# Patient Record
Sex: Female | Born: 1985
Health system: Southern US, Community
[De-identification: ages and names within clinical notes are randomized; demographics above are authoritative.]

## PROBLEM LIST (undated history)

## (undated) DIAGNOSIS — F429 Obsessive-compulsive disorder, unspecified: Secondary | ICD-10-CM

## (undated) DIAGNOSIS — E039 Hypothyroidism, unspecified: Secondary | ICD-10-CM

## (undated) DIAGNOSIS — Z8619 Personal history of other infectious and parasitic diseases: Secondary | ICD-10-CM

## (undated) DIAGNOSIS — R87629 Unspecified abnormal cytological findings in specimens from vagina: Secondary | ICD-10-CM

## (undated) DIAGNOSIS — J45909 Unspecified asthma, uncomplicated: Secondary | ICD-10-CM

## (undated) HISTORY — DX: Personal history of other infectious and parasitic diseases: Z86.19

## (undated) HISTORY — DX: Unspecified abnormal cytological findings in specimens from vagina: R87.629

## (undated) HISTORY — DX: Obsessive-compulsive disorder, unspecified: F42.9

## (undated) HISTORY — DX: Hypothyroidism, unspecified: E03.9

## (undated) HISTORY — PX: LEEP: SHX91

## (undated) SURGERY — COLONOSCOPY WITH PROPOFOL
Anesthesia: Monitor Anesthesia Care

---

## 2015-04-26 LAB — OB RESULTS CONSOLE GBS: GBS: NEGATIVE

## 2015-10-25 LAB — OB RESULTS CONSOLE HEPATITIS B SURFACE ANTIGEN: HEP B S AG: NEGATIVE

## 2015-10-25 LAB — OB RESULTS CONSOLE ABO/RH: RH Type: POSITIVE

## 2015-10-25 LAB — OB RESULTS CONSOLE RUBELLA ANTIBODY, IGM: Rubella: IMMUNE

## 2015-10-25 LAB — OB RESULTS CONSOLE GC/CHLAMYDIA
CHLAMYDIA, DNA PROBE: NEGATIVE
Gonorrhea: NEGATIVE

## 2015-10-25 LAB — OB RESULTS CONSOLE HIV ANTIBODY (ROUTINE TESTING): HIV: NONREACTIVE

## 2015-10-25 LAB — OB RESULTS CONSOLE RPR: RPR: NONREACTIVE

## 2015-10-25 LAB — OB RESULTS CONSOLE ANTIBODY SCREEN: Antibody Screen: NEGATIVE

## 2015-12-03 ENCOUNTER — Inpatient Hospital Stay (HOSPITAL_COMMUNITY): Admission: AD | Admit: 2015-12-03 | Payer: Self-pay | Source: Ambulatory Visit | Admitting: Obstetrics and Gynecology

## 2016-04-24 NOTE — L&D Delivery Note (Signed)
Delivery Note  First Stage: Labor onset: 0900 Augmentation : pitocin Analgesia /Anesthesia intrapartum: epidural AROM at 1458 - MSF  Second Stage: Complete dilation at 1924 Onset of pushing at 1945 FHR second stage category 2 - variable decels Particulate MSF with second stage - NICU team in attendance at birth  Bulb suctioned on perineum / vigorous cry at birth NICU team excused from birth  Delivery of a viable female at 2013 by CNM in LOA position no nuchal cord / noted short cord Cord double clamped after cessation of pulsation, cut by FOB Cord blood sample collected   Arterial cord blood sample collected   Third Stage: Placenta delivered [facility] intact with 3 VC @ 20127 Placenta disposition: hospital disposal Uterine tone firm / bleeding moderate  2nd perineal laceration identified  Anesthesia for repair: epidural Repair 3-0 vicryl deep interrupted muscle repair with 4-0 vicryl subcuticular Est. Blood Loss (mL): [id number]  Complications: none  Mom to postpartum.  Baby to Couplet care / Skin to Skin.  Newborn: Birth Weight: 7 pounds 12.7 oz  Apgar Scores: 8-9 Feeding planned: breast  Artelia Laroche CNM, MSN, [place] 05/26/2016, 8:48 PM

## 2016-04-25 LAB — OB RESULTS CONSOLE GBS: STREP GROUP B AG: NEGATIVE

## 2016-05-25 ENCOUNTER — Telehealth (HOSPITAL_COMMUNITY): Payer: Self-pay | Admitting: *Deleted

## 2016-05-25 ENCOUNTER — Encounter (HOSPITAL_COMMUNITY): Payer: Self-pay | Admitting: *Deleted

## 2016-05-25 NOTE — Telephone Encounter (Signed)
Preadmission screen  

## 2016-05-26 ENCOUNTER — Inpatient Hospital Stay (HOSPITAL_COMMUNITY): Payer: BLUE CROSS/BLUE SHIELD | Admitting: Anesthesiology

## 2016-05-26 ENCOUNTER — Other Ambulatory Visit: Payer: Self-pay | Admitting: Certified Nurse Midwife

## 2016-05-26 ENCOUNTER — Inpatient Hospital Stay (HOSPITAL_COMMUNITY)
Admission: AD | Admit: 2016-05-26 | Discharge: 2016-05-28 | DRG: 775 | Disposition: A | Discharge status: Home / Self Care | Payer: BLUE CROSS/BLUE SHIELD | Source: Ambulatory Visit | Attending: Obstetrics and Gynecology | Admitting: Obstetrics and Gynecology

## 2016-05-26 ENCOUNTER — Encounter (HOSPITAL_COMMUNITY): Payer: Self-pay | Admitting: *Deleted

## 2016-05-26 DIAGNOSIS — O99214 Obesity complicating childbirth: Secondary | ICD-10-CM | POA: Diagnosis present

## 2016-05-26 DIAGNOSIS — E669 Obesity, unspecified: Secondary | ICD-10-CM | POA: Diagnosis present

## 2016-05-26 DIAGNOSIS — F411 Generalized anxiety disorder: Secondary | ICD-10-CM | POA: Diagnosis present

## 2016-05-26 DIAGNOSIS — Z3A41 41 weeks gestation of pregnancy: Secondary | ICD-10-CM

## 2016-05-26 DIAGNOSIS — O9962 Diseases of the digestive system complicating childbirth: Secondary | ICD-10-CM | POA: Diagnosis present

## 2016-05-26 DIAGNOSIS — O99344 Other mental disorders complicating childbirth: Secondary | ICD-10-CM | POA: Diagnosis present

## 2016-05-26 DIAGNOSIS — E039 Hypothyroidism, unspecified: Secondary | ICD-10-CM | POA: Diagnosis present

## 2016-05-26 DIAGNOSIS — Z6830 Body mass index (BMI) 30.0-30.9, adult: Secondary | ICD-10-CM

## 2016-05-26 DIAGNOSIS — O480 Post-term pregnancy: Secondary | ICD-10-CM | POA: Diagnosis present

## 2016-05-26 DIAGNOSIS — F429 Obsessive-compulsive disorder, unspecified: Secondary | ICD-10-CM | POA: Diagnosis present

## 2016-05-26 DIAGNOSIS — K219 Gastro-esophageal reflux disease without esophagitis: Secondary | ICD-10-CM | POA: Diagnosis present

## 2016-05-26 DIAGNOSIS — O99284 Endocrine, nutritional and metabolic diseases complicating childbirth: Secondary | ICD-10-CM | POA: Diagnosis present

## 2016-05-26 LAB — CBC
HEMATOCRIT: 38.5 % (ref 36.0–46.0)
HEMOGLOBIN: 13.6 g/dL (ref 12.0–15.0)
MCH: 31.1 pg (ref 26.0–34.0)
MCHC: 35.3 g/dL (ref 30.0–36.0)
MCV: 87.9 fL (ref 78.0–100.0)
Platelets: 186 10*3/uL (ref 150–400)
RBC: 4.38 MIL/uL (ref 3.87–5.11)
RDW: 13.6 % (ref 11.5–15.5)
WBC: 8.2 10*3/uL (ref 4.0–10.5)

## 2016-05-26 LAB — TYPE AND SCREEN
ABO/RH(D): O POS
ANTIBODY SCREEN: NEGATIVE

## 2016-05-26 LAB — ABO/RH: ABO/RH(D): O POS

## 2016-05-26 MED ORDER — LACTATED RINGERS IV SOLN: INTRAVENOUS | Status: DC

## 2016-05-26 MED ORDER — EPHEDRINE 5 MG/ML INJ
10.0000 mg | INTRAVENOUS | Status: DC | PRN
  Filled 2016-05-26: qty 4

## 2016-05-26 MED ORDER — OXYTOCIN 40 UNITS IN LACTATED RINGERS INFUSION - SIMPLE MED
1.0000 m[IU]/min | INTRAVENOUS | Status: DC
Start: 2016-05-26 — End: 2016-05-26
  Administered 2016-05-26: 8 m[IU]/min via INTRAVENOUS
  Administered 2016-05-26: 2 m[IU]/min via INTRAVENOUS
  Filled 2016-05-26: qty 1000

## 2016-05-26 MED ORDER — MONTELUKAST SODIUM 10 MG PO TABS
10.0000 mg | ORAL_TABLET | Freq: Every day | ORAL | Status: DC
  Administered 2016-05-27: 10 mg via ORAL
  Filled 2016-05-26 (×3): qty 1

## 2016-05-26 MED ORDER — DIBUCAINE 1 % RE OINT: 1.0000 "application " | TOPICAL_OINTMENT | RECTAL | Status: DC | PRN

## 2016-05-26 MED ORDER — LACTATED RINGERS IV SOLN: 500.0000 mL | Freq: Once | INTRAVENOUS | Status: DC

## 2016-05-26 MED ORDER — BENZOCAINE-MENTHOL 20-0.5 % EX AERO
1.0000 "application " | INHALATION_SPRAY | CUTANEOUS | Status: DC | PRN
  Filled 2016-05-26: qty 56

## 2016-05-26 MED ORDER — OXYTOCIN 40 UNITS IN LACTATED RINGERS INFUSION - SIMPLE MED: 2.5000 [IU]/h | INTRAVENOUS | Status: DC

## 2016-05-26 MED ORDER — FENTANYL 2.5 MCG/ML BUPIVACAINE 1/10 % EPIDURAL INFUSION (WH - ANES)
14.0000 mL/h | INTRAMUSCULAR | Status: DC | PRN
  Administered 2016-05-26: 14 mL/h via EPIDURAL
  Filled 2016-05-26: qty 100

## 2016-05-26 MED ORDER — PHENYLEPHRINE 40 MCG/ML (10ML) SYRINGE FOR IV PUSH (FOR BLOOD PRESSURE SUPPORT)
80.0000 ug | PREFILLED_SYRINGE | INTRAVENOUS | Status: DC | PRN
  Filled 2016-05-26: qty 5

## 2016-05-26 MED ORDER — WITCH HAZEL-GLYCERIN EX PADS
1.0000 "application " | MEDICATED_PAD | CUTANEOUS | Status: DC | PRN
  Administered 2016-05-28: 1 via TOPICAL

## 2016-05-26 MED ORDER — SOD CITRATE-CITRIC ACID 500-334 MG/5ML PO SOLN: 30.0000 mL | ORAL | Status: DC | PRN

## 2016-05-26 MED ORDER — NALBUPHINE HCL 10 MG/ML IJ SOLN
10.0000 mg | Freq: Once | INTRAMUSCULAR | Status: AC
  Administered 2016-05-26: 10 mg via INTRAVENOUS
  Filled 2016-05-26: qty 1

## 2016-05-26 MED ORDER — OXYCODONE-ACETAMINOPHEN 5-325 MG PO TABS: 2.0000 | ORAL_TABLET | ORAL | Status: DC | PRN

## 2016-05-26 MED ORDER — OXYCODONE-ACETAMINOPHEN 5-325 MG PO TABS: 1.0000 | ORAL_TABLET | ORAL | Status: DC | PRN

## 2016-05-26 MED ORDER — ACETAMINOPHEN 325 MG PO TABS: 650.0000 mg | ORAL_TABLET | ORAL | Status: DC | PRN

## 2016-05-26 MED ORDER — OXYTOCIN BOLUS FROM INFUSION: 500.0000 mL | Freq: Once | INTRAVENOUS | Status: DC

## 2016-05-26 MED ORDER — OXYTOCIN 10 UNIT/ML IJ SOLN
10.0000 [IU] | Freq: Once | INTRAMUSCULAR | Status: DC | PRN
  Filled 2016-05-26: qty 1

## 2016-05-26 MED ORDER — SIMETHICONE 80 MG PO CHEW
80.0000 mg | CHEWABLE_TABLET | ORAL | Status: DC | PRN
  Administered 2016-05-27: 80 mg via ORAL
  Filled 2016-05-26: qty 1

## 2016-05-26 MED ORDER — LEVOTHYROXINE SODIUM 50 MCG PO TABS
50.0000 ug | ORAL_TABLET | Freq: Every day | ORAL | Status: DC
  Administered 2016-05-27 – 2016-05-28 (×2): 50 ug via ORAL
  Filled 2016-05-26 (×3): qty 1

## 2016-05-26 MED ORDER — IBUPROFEN 600 MG PO TABS
600.0000 mg | ORAL_TABLET | Freq: Four times a day (QID) | ORAL | Status: DC
  Administered 2016-05-26 – 2016-05-28 (×7): 600 mg via ORAL
  Filled 2016-05-26 (×7): qty 1

## 2016-05-26 MED ORDER — LIDOCAINE HCL (PF) 1 % IJ SOLN
INTRAMUSCULAR | Status: DC | PRN
  Administered 2016-05-26: 2 mL via EPIDURAL
  Administered 2016-05-26: 3 mL via EPIDURAL
  Administered 2016-05-26: 5 mL via EPIDURAL

## 2016-05-26 MED ORDER — SENNOSIDES-DOCUSATE SODIUM 8.6-50 MG PO TABS
2.0000 | ORAL_TABLET | ORAL | Status: DC
  Administered 2016-05-27: 2 via ORAL
  Filled 2016-05-26: qty 2

## 2016-05-26 MED ORDER — LACTATED RINGERS IV SOLN: 500.0000 mL | INTRAVENOUS | Status: DC | PRN

## 2016-05-26 MED ORDER — COCONUT OIL OIL: 1.0000 "application " | TOPICAL_OIL | Status: DC | PRN

## 2016-05-26 MED ORDER — DOCUSATE SODIUM 100 MG PO CAPS
100.0000 mg | ORAL_CAPSULE | Freq: Every day | ORAL | Status: DC
  Administered 2016-05-27 – 2016-05-28 (×2): 100 mg via ORAL
  Filled 2016-05-26 (×2): qty 1

## 2016-05-26 MED ORDER — TERBUTALINE SULFATE 1 MG/ML IJ SOLN
0.2500 mg | Freq: Once | INTRAMUSCULAR | Status: DC | PRN
  Filled 2016-05-26: qty 1

## 2016-05-26 MED ORDER — DIPHENHYDRAMINE HCL 50 MG/ML IJ SOLN: 12.5000 mg | INTRAMUSCULAR | Status: DC | PRN

## 2016-05-26 MED ORDER — PHENYLEPHRINE 40 MCG/ML (10ML) SYRINGE FOR IV PUSH (FOR BLOOD PRESSURE SUPPORT)
80.0000 ug | PREFILLED_SYRINGE | INTRAVENOUS | Status: DC | PRN
  Filled 2016-05-26: qty 5
  Filled 2016-05-26: qty 10

## 2016-05-26 MED ORDER — LIDOCAINE HCL (PF) 1 % IJ SOLN
30.0000 mL | INTRAMUSCULAR | Status: DC | PRN
  Filled 2016-05-26: qty 30

## 2016-05-26 NOTE — H&P (Signed)
OB ADMISSION/ HISTORY & PHYSICAL:  Admission Date: 05/26/2016 12:30 PM  Admit Diagnosis: Postdates Pregnancy / Early Labor   Wendy Clements is a 31 y.o. female presenting for mild irregular contractions all morning. She was seen in the office this morning by Dr. Benjie Karvonen. She was sent here for direct admission to L&D.  Prenatal History: G3P0020   EDC : 05/16/2016, by Ultrasound Prenatal care at [facility] since 10.[redacted] weeks gestation Primary Care Provider at [facility]: Artelia Laroche, CNM  Prenatal course complicated by H/O LEEP / GERD / Generalized Anxiety Disorder / Hyothyroidism / Obsessive Compulsive disorder  Prenatal Labs: ABO, Rh: O (07/03 0000)  Antibody: Negative (07/03 0000) Rubella: Immune (07/03 0000)  RPR: Nonreactive (07/03 0000)  HBsAg: Negative (07/03 0000)  HIV: Non-reactive (07/03 0000)  GBS: Negative (01/02 0000)  GTT : Abnormal 1 hr / Normal 3 hr    Medical / Surgical History :  Past medical history:  Past Medical History:  Diagnosis Date  . Hx of varicella   . Hypothyroidism   . OCD (obsessive compulsive disorder)   . Vaginal Pap smear, abnormal      Past surgical history:  Past Surgical History:  Procedure Laterality Date  . LEEP       Family History:  Family History  Problem Relation Age of Onset  . Cancer Maternal Grandmother      Social History:  reports that she has never smoked. She has never used smokeless tobacco. She reports that she does not drink alcohol or use drugs.   Allergies: Patient has no known allergies.    Current Medications at time of admission:  Prescriptions Prior to Admission  Medication Sig Dispense Refill Last Dose  . levothyroxine (SYNTHROID, LEVOTHROID) 25 MCG tablet Take 0.5 mcg by mouth daily before breakfast.   05/26/2016 at Unknown time  . montelukast (SINGULAIR) 10 MG tablet Take 10 mg by mouth at bedtime.         Review of Systems: Active FM onset of ctx this AM currently every  5-6 minutes bloody show minimal   Physical Exam:  Today's Vitals   05/26/16 1303  BP: 115/78  Pulse: 84  Resp: 18  Temp: 98 F (36.7 C)  TempSrc: Oral  Weight: 75.3 kg (166 lb)  Height: 5\' 2"  (1.575 m)  PainSc: 2    General: alert and oriented x 3, NAD Heart: RRR, no murmurs Lungs: CTAB Abdomen: Soft and non-tender, non-distended / Uterus: Gravid, non-tender Extremities: no edema Genitalia / VE: Dilation: 3 Effacement (%): 70 Station: -1 Exam by:: R Sudeep Scheibel CNM  Membrane sweep - pt tolerated well FHR: 135 bpm / moderate variability / accels present / no decels TOCO: regular every 5-6 mins  Labs:    No results for input(s): WBC, HGB, HCT, PLT in the last 72 hours.   Assessment:  31 y.o. G3P0020 at [redacted]w[redacted]d  1. Labor: early 2. Fetal Wellbeing: Category 1  3. Pain Control: planning an epidural 4. GBS: Negative   Plan:  1. Admit to BS 2. Routine L&D orders 3. Analgesia/anesthesia PRN     Dr. Ronita Hipps notified of admission / plan of care    Graceann Congress, MSN, CNM 05/26/2016, 1:45 PM

## 2016-05-26 NOTE — Lactation Note (Signed)
This note was copied from a baby's chart. Lactation Consultation Note  Patient Name: Wendy Clements [id number] Date: 05/26/2016 Reason for consult: Initial assessment Baby at 1 hr of life. Mom has firm breast with short nipples. Baby has a nice gape, flanged lips, and rhythmic suck. Baby needs mild stimulation to maintain bursts of sucking. Discussed baby behavior, feeding frequency, baby belly size, voids, wt loss, breast changes, and nipple care. Given lactation handouts. Aware of OP services and support group.    Maternal Data Has patient been taught Hand Expression?: Yes Does the patient have breastfeeding experience prior to this delivery?: No  Feeding Feeding Type: Breast Fed  LATCH Score/Interventions Latch: Repeated attempts needed to sustain latch, nipple held in mouth throughout feeding, stimulation needed to elicit sucking reflex. Intervention(s): Adjust position;Assist with latch;Breast massage;Breast compression  Audible Swallowing: A few with stimulation Intervention(s): Skin to skin  Type of Nipple: Everted at rest and after stimulation  Comfort (Breast/Nipple): Soft / non-tender     Hold (Positioning): Full assist, staff holds infant at breast Intervention(s): Position options  LATCH Score: 6  Lactation Tools Discussed/Used     Consult Status Consult Status: Follow-up Date: 05/27/16 Follow-up type: In-patient    Denzil Hughes 05/26/2016, 9:18 PM

## 2016-05-26 NOTE — Anesthesia Procedure Notes (Signed)
Epidural Patient location during procedure: OB Start time: 05/26/2016 6:17 PM End time: 05/26/2016 6:21 PM  Staffing Anesthesiologist: Catalina Gravel Performed: anesthesiologist   Preanesthetic Checklist Completed: patient identified, pre-op evaluation, timeout performed, IV checked, risks and benefits discussed and monitors and equipment checked  Epidural Patient position: sitting Prep: DuraPrep Patient monitoring: blood pressure and continuous pulse ox Approach: midline Location: L3-L4 Injection technique: LOR air  Needle:  Needle type: Tuohy  Needle gauge: 17 G Needle length: 9 cm Needle insertion depth: 4 cm Catheter size: 19 Gauge Catheter at skin depth: 9 cm Test dose: negative and Other (1% Lidocaine)  Additional Notes Patient identified.  Risk benefits discussed including failed block, incomplete pain control, headache, nerve damage, paralysis, blood pressure changes, nausea, vomiting, reactions to medication both toxic or allergic, and postpartum back pain.  Patient expressed understanding and wished to proceed.  All questions were answered.  Sterile technique used throughout procedure and epidural site dressed with sterile barrier dressing. No paresthesia or other complications noted. The patient did not experience any signs of intravascular injection such as tinnitus or metallic taste in mouth nor signs of intrathecal spread such as rapid motor block. Please see nursing notes for vital signs. Reason for block:procedure for pain

## 2016-05-26 NOTE — Anesthesia Pain Management Evaluation Note (Signed)
  CRNA Pain Management Visit Note  Patient: Wendy Clements, 31 y.o., female  "Hello I am a member of the anesthesia team at [facility]. We have an anesthesia team available at all times to provide care throughout the hospital, including epidural management and anesthesia for C-section. I don't know your plan for the delivery whether it a natural birth, water birth, IV sedation, nitrous supplementation, doula or epidural, but we want to meet your pain goals."   1.Was your pain managed to your expectations on prior hospitalizations?   No prior hospitalizations  2.What is your expectation for pain management during this hospitalization?     Epidural  3.How can we help you reach that goal? unsure  Record the patient's initial score and the patient's pain goal.   Pain: 2  Pain Goal: 6 [facility] wants you to be able to say your pain was always managed very well.  Casimer Lanius 05/26/2016

## 2016-05-26 NOTE — Progress Notes (Signed)
Patient ID: Wendy Clements, female   DOB: 06-Dec-1985, 31 y.o.   MRN: [id number] Subjective: Wendy Clements is a 31 y.o. G3P0020 at [redacted]w[redacted]d by ultrasound admitted for postdates / early labor.  Objective: Vitals:   05/26/16 1303  BP: 115/78  Pulse: 84  Resp: 18  Temp: 98 F (36.7 C)  TempSrc: Oral  Weight: 75.3 kg (166 lb)  Height: 5\' 2"  (1.575 m)      FHT:  FHR: 135 bpm, variability: moderate,  accelerations:  Present,  decelerations:  Absent UC:   irregular, every 2-4 minutes SVE:   Dilation: 3 Effacement (%): 70 Station: -1 Exam by:: R Dilan Fullenwider CNM AROM with small amount of bloody fluid  Labs:   Recent Labs  05/26/16 1421  WBC 8.2  HGB 13.6  HCT 38.5  PLT 186    Assessment / Plan: Spontaneous labor, progressing normally  Labor: Progressing normally  Start low-dose pitocin Preeclampsia:  n/a Fetal Wellbeing:  Category I Pain Control:  Labor support without medications Anticipated MOD:  NSVD   **Care assumed by Artelia Laroche, CNM at this time.  Graceann Congress, MSN, CNM 05/26/2016, 3:06 PM

## 2016-05-26 NOTE — Anesthesia Preprocedure Evaluation (Signed)
Anesthesia Evaluation  Patient identified by MRN, date of birth, ID band Patient awake    Reviewed: Allergy & Precautions, NPO status , Patient's Chart, lab work & pertinent test results  Airway Mallampati: II  TM Distance: >3 FB Neck ROM: Full    Dental  (+) Teeth Intact, Dental Advisory Given   Pulmonary asthma ,    Pulmonary exam normal breath sounds clear to auscultation       Cardiovascular negative cardio ROS Normal cardiovascular exam Rhythm:Regular Rate:Normal     Neuro/Psych PSYCHIATRIC DISORDERS (OCD) negative neurological ROS     GI/Hepatic negative GI ROS, Neg liver ROS,   Endo/Other  Hypothyroidism Obesity   Renal/GU negative Renal ROS     Musculoskeletal negative musculoskeletal ROS (+)   Abdominal   Peds  Hematology negative hematology ROS (+) Plt 186k   Anesthesia Other Findings Day of surgery medications reviewed with the patient.  Reproductive/Obstetrics (+) Pregnancy                             Anesthesia Physical Anesthesia Plan  ASA: II  Anesthesia Plan: Epidural   Post-op Pain Management:    Induction:   Airway Management Planned:   Additional Equipment:   Intra-op Plan:   Post-operative Plan:   Informed Consent: I have reviewed the patients History and Physical, chart, labs and discussed the procedure including the risks, benefits and alternatives for the proposed anesthesia with the patient or authorized representative who has indicated his/her understanding and acceptance.   Dental advisory given  Plan Discussed with:   Anesthesia Plan Comments: (Patient identified. Risks/Benefits/Options discussed with patient including but not limited to bleeding, infection, nerve damage, paralysis, failed block, incomplete pain control, headache, blood pressure changes, nausea, vomiting, reactions to medication both or allergic, itching and postpartum back pain.  Confirmed with bedside nurse the patient's most recent platelet count. Confirmed with patient that they are not currently taking any anticoagulation, have any bleeding history or any family history of bleeding disorders. Patient expressed understanding and wished to proceed. All questions were answered. )        Anesthesia Quick Evaluation

## 2016-05-27 LAB — CBC
HCT: 29.5 % — ABNORMAL LOW (ref 36.0–46.0)
Hemoglobin: 10.7 g/dL — ABNORMAL LOW (ref 12.0–15.0)
MCH: 31.9 pg (ref 26.0–34.0)
MCHC: 36.3 g/dL — ABNORMAL HIGH (ref 30.0–36.0)
MCV: 88.1 fL (ref 78.0–100.0)
Platelets: 166 10*3/uL (ref 150–400)
RBC: 3.35 MIL/uL — ABNORMAL LOW (ref 3.87–5.11)
RDW: 13.7 % (ref 11.5–15.5)
WBC: 12.5 10*3/uL — ABNORMAL HIGH (ref 4.0–10.5)

## 2016-05-27 LAB — RPR: RPR Ser Ql: NONREACTIVE

## 2016-05-27 NOTE — Progress Notes (Signed)
Patient ID: Wendy Clements, female   DOB: 1985-05-27, 31 y.o.   MRN: [id number] PPD # 1 SVD  S:  Reports feeling well, but bottom is still very sore.             Tolerating po/ No nausea or vomiting             Bleeding is light             Pain controlled with ibuprofen (OTC)             Up ad lib / ambulatory / voiding without difficulties     Information for the patient's newborn:  Kalliyan, Gunnett Girl Alonzo [id number]  female "Scarlett"   breast feeding    O:  A & O x 3, in no apparent distress              VS:  Vitals:   05/26/16 2230 05/26/16 2302 05/27/16 0003 05/27/16 0402  BP: (!) 88/61 (!) 101/59 101/62 (!) 102/55  Pulse: (!) 124 (!) 104 99 98  Resp:  20 18 18   Temp:  99.7 F (37.6 C) 98.6 F (37 C) 97.7 F (36.5 C)  TempSrc:  Oral Oral Oral  SpO2:  99% 100% 98%  Weight:      Height:        LABS:  Recent Labs  05/26/16 1421 05/27/16 0534  WBC 8.2 12.5*  HGB 13.6 10.7*  HCT 38.5 29.5*  PLT 186 166    Blood type: --/--/O POS (02/02 1420)  Rubella: Immune (07/03 0000)   I&O: I/O last 3 completed shifts: In: -  Out: 500 [Urine:200; Blood:300]          No intake/output data recorded.    Abdomen: soft, non-tender, non-distended             Fundus: firm, non-tender, U-even, deviated to maternal RT  Perineum: 2nd degree repair healing well, no edema  Lochia: minimal  Extremities: Trace edema, no calf pain or tenderness    A/P: PPD # 1 31 y.o., [id number]   Principal Problem:   Postpartum care following vaginal delivery (2/2) Active Problems:   Indication for care in labor or delivery   SVD (spontaneous vaginal delivery)    Doing well - stable status  Routine post partum orders  Anticipate discharge tomorrow    Laury Deep, M, MSN, CNM 05/27/2016, 9:10 AM

## 2016-05-27 NOTE — Lactation Note (Signed)
This note was copied from a baby's chart. Lactation Consultation Note  Patient Name: Wendy Clements [id number] Date: 05/27/2016 Reason for consult: Follow-up assessment Infant is 75 hours old & seen by lactation for follow-up assessment. RN told LC that mom was going to try BF soon and to see if LC could go in to talk with mom since she had not fed since 11am today. Baby was in football hold on right breast when [place] entered. Mom had several visitors so was using a cover-up. Did not see initial latch, but baby did come off a little while LC observing. Baby's lips were flanged but latch did not seem very deep so encouraged mom to continue supporting her breast throughout the whole feeding so she stays latched onto her breast and not just the nipple. Mom reports that baby cluster fed from ~3am-8am and then fed again at 11am and has been sleeping since then so she didn't think baby needed to feed. Mom reports that one of the feedings she noticed that her nipple was squished after BF so knows that it was not a deep latch even though it was comfortable. Mom encouraged to feed baby 8-12 times/24 hours and with feeding cues. And that if baby goes ~3.5hrs without feeding cues for mom to put baby skin-to-skin to see if that will arouse her to wake up & eat.  Mom stated her neighbor is an IBCLC so she has received a lot of education prenatally & knows she can ask her for help once home. Mom reports no questions at this time. Encouraged mom to ask for nurse at future feedings if help is needed.   Maternal Data    Feeding Feeding Type: Breast Fed Length of feed:  (baby still feeding when LC left)  LATCH Score/Interventions Latch: Repeated attempts needed to sustain latch, nipple held in mouth throughout feeding, stimulation needed to elicit sucking reflex.  Audible Swallowing: A few with stimulation Intervention(s): Hand expression;Alternate breast massage;Skin to skin  Type of Nipple: Everted at rest  and after stimulation  Comfort (Breast/Nipple): Soft / non-tender     Hold (Positioning): No assistance needed to correctly position infant at breast. Intervention(s): Breastfeeding basics reviewed  LATCH Score: 8  Lactation Tools Discussed/Used     Consult Status Consult Status: Follow-up Date: 05/28/16    Yvonna Alanis 05/27/2016, 6:39 PM

## 2016-05-27 NOTE — Anesthesia Postprocedure Evaluation (Signed)
Anesthesia Post Note  Patient: Wendy Clements  Procedure(s) Performed: * No procedures listed *  Patient location during evaluation: Mother Baby Anesthesia Type: Epidural Level of consciousness: awake and alert Pain management: pain level controlled Vital Signs Assessment: post-procedure vital signs reviewed and stable Respiratory status: spontaneous breathing, nonlabored ventilation and respiratory function stable Cardiovascular status: stable Postop Assessment: no headache, no backache and epidural receding Anesthetic complications: no        Last Vitals:  Vitals:   05/27/16 0003 05/27/16 0402  BP: 101/62 (!) 102/55  Pulse: 99 98  Resp: 18 18  Temp: 37 C 36.5 C    Last Pain:  Vitals:   05/27/16 0555  TempSrc:   PainSc: 3    Pain Goal:                 Riki Sheer

## 2016-05-28 MED ORDER — IBUPROFEN 600 MG PO TABS: 600.0000 mg | ORAL_TABLET | Freq: Four times a day (QID) | ORAL | 0 refills | Status: DC

## 2016-05-28 NOTE — Lactation Note (Signed)
This note was copied from a baby's chart. Lactation Consultation Note  Patient Name: Wendy Clements [id number] Date: 05/28/2016  Follow up visit made prior to discharge.  Mom states baby cluster fed last night and it was somewhat overwhelming.  Reassured this is normal and her volume of milk should increase in next 24-48 hours.  Reviewed techniques for a deeper latch.  Discharge teaching done including engorgement treatment.  Lactation outpatient services and support reviewed and encouraged prn.   Maternal Data    Feeding    LATCH Score/Interventions                      Lactation Tools Discussed/Used     Consult Status      Ave Filter 05/28/2016, 9:37 AM

## 2016-05-28 NOTE — Progress Notes (Signed)
MOB was referred for history of anxiety/OCD.  Referral is screened out by Clinical Social Worker because none of the following criteria appear to apply and there are no reports impacting the pregnancy or her transition to the postpartum period.  CSW does not deem it clinically necessary to further investigate at this time.   -History of anxiety/OCD during this pregnancy, or of post-partum depression - Diagnosis of anxiety and/or OCD within last 3 years - History of anxiety due to pregnancy loss/loss of child or -MOB's symptoms are currently being treated with medication and/or therapy  CSW met with MOB at bedside in attempt to complete assessment for consult regarding hx of anxiety/ OCD. MOB notes neither of those are formal dx and she is not taking med's to treat either. MOB notes she experienced anxiety several years ago and does not feel she needs assessment of either. Please contact the Clinical Social Worker if needs arise or upon MOB request.    Oda Cogan, MSW, [facility]  Office: [phone]

## 2016-05-28 NOTE — Discharge Summary (Signed)
OB Discharge Summary     Patient Name: Wendy Clements DOB: 12-Mar-1986 MRN: [id number]  Date of admission: 05/26/2016 Delivering MD: Artelia Laroche   Date of discharge: 05/28/2016  Admitting diagnosis: EARLY LABOR Intrauterine pregnancy: [redacted]w[redacted]d     Secondary diagnosis:  Principal Problem:   Postpartum care following vaginal delivery (2/2) Active Problems:   Indication for care in labor or delivery   SVD (spontaneous vaginal delivery)  Additional problems: none     Discharge diagnosis: Postterm Pregnancy, delivered                                                                                                Post partum procedures:none  Augmentation: AROM, Pitocin and Foley Balloon  Complications: None  Hospital course:  Onset of Labor With Vaginal Delivery     31 y.o. yo [id number] at [redacted]w[redacted]d was admitted in Latent Labor on 05/26/2016. Patient had an uncomplicated labor course as follows:  Membrane Rupture Time/Date: 2:58 PM ,05/26/2016   Intrapartum Procedures: Episiotomy: None [1]                                         Lacerations:  2nd degree [3];Perineal [11]  Patient had a delivery of a Viable infant. 05/26/2016  Information for the patient's newborn:  Roha, Pond [id number]  Delivery Method: Vag-Spont    Pateint had an uncomplicated postpartum course.  She is ambulating, tolerating a regular diet, passing flatus, and urinating well. Patient is discharged home in stable condition on 05/28/16.   Physical exam  Vitals:   05/26/16 2302 05/27/16 0003 05/27/16 0402 05/27/16 1834  BP: (!) 101/59 101/62 (!) 102/55 115/71  Pulse: (!) 104 99 98 98  Resp: 20 18 18 16   Temp: 99.7 F (37.6 C) 98.6 F (37 C) 97.7 F (36.5 C) 99.1 F (37.3 C)  TempSrc: Oral Oral Oral Oral  SpO2: 99% 100% 98% 100%  Weight:      Height:       General: alert, cooperative and no distress Lochia: appropriate Uterine Fundus: firm, midline, U-1 DVT Evaluation: No evidence of DVT seen  on physical exam. Negative Homan's sign. No cords or calf tenderness. No significant calf/ankle edema. Labs: Lab Results  Component Value Date   WBC 12.5 (H) 05/27/2016   HGB 10.7 (L) 05/27/2016   HCT 29.5 (L) 05/27/2016   MCV 88.1 05/27/2016   PLT 166 05/27/2016   No flowsheet data found.  Discharge instruction: per After Visit Summary and "Baby and Me Booklet".  After visit meds:  Allergies as of 05/28/2016   No Known Allergies     Medication List    TAKE these medications   acetaminophen 500 MG tablet Commonly known as:  TYLENOL Take 1,000 mg by mouth every 6 (six) hours as needed for mild pain.   ADVAIR DISKUS 250-50 MCG/DOSE Aepb Generic drug:  Fluticasone-Salmeterol Inhale 1 puff into the lungs daily.   albuterol 108 (90 Base) MCG/ACT inhaler Commonly known as:  PROVENTIL HFA;VENTOLIN  HFA Inhale 1-2 puffs into the lungs every 6 (six) hours as needed for wheezing or shortness of breath.   Butalbital-APAP-Caffeine 50-300-40 MG Caps Take 1 tablet by mouth daily as needed.   calcium carbonate 500 MG chewable tablet Commonly known as:  TUMS - dosed in mg elemental calcium Chew 1-2 tablets by mouth 3 (three) times daily as needed for indigestion or heartburn.   ibuprofen 600 MG tablet Commonly known as:  ADVIL,MOTRIN Take 1 tablet (600 mg total) by mouth every 6 (six) hours.   levothyroxine 50 MCG tablet Commonly known as:  SYNTHROID, LEVOTHROID Take 50 mcg by mouth daily.   montelukast 10 MG tablet Commonly known as:  SINGULAIR Take 10 mg by mouth at bedtime.   prenatal multivitamin Tabs tablet Take 1 tablet by mouth daily at 12 noon.   ranitidine 150 MG tablet Commonly known as:  ZANTAC Take 150 mg by mouth 2 (two) times daily.       Diet: routine diet  Activity: Advance as tolerated. Pelvic rest for 6 weeks.   Outpatient follow up:6 weeks Follow up Appt:No future appointments. Follow up Visit:No Follow-up on file.  Postpartum contraception:  Undecided  Newborn Data: Live born female "Scarlett" on 05/26/2016 Birth Weight: 7 lb 12.7 oz (3535 g) APGAR: 8, 9  Baby Feeding: Breast Disposition:home with mother   05/28/2016 Laury Deep, Jerilynn Mages, CNM

## 2016-05-28 NOTE — Discharge Instructions (Signed)
Postpartum Depression and Baby Blues The postpartum period begins right after the birth of a baby. During this time, there is often a great amount of joy and excitement. It is also a time of many changes in the life of the parents. Regardless of how many times a mother gives birth, each child brings new challenges and dynamics to the family. It is not unusual to have feelings of excitement along with confusing shifts in moods, emotions, and thoughts. All mothers are at risk of developing postpartum depression or the "baby blues." These mood changes can occur right after giving birth, or they may occur many months after giving birth. The baby blues or postpartum depression can be mild or severe. Additionally, postpartum depression can go away rather quickly, or it can be a long-term condition. What are the causes? Raised hormone levels and the rapid drop in those levels are thought to be a main cause of postpartum depression and the baby blues. A number of hormones change during and after pregnancy. Estrogen and progesterone usually decrease right after the delivery of your baby. The levels of thyroid hormone and various cortisol steroids also rapidly drop. Other factors that play a role in these mood changes include major life events and genetics. What increases the risk? If you have any of the following risks for the baby blues or postpartum depression, know what symptoms to watch out for during the postpartum period. Risk factors that may increase the likelihood of getting the baby blues or postpartum depression include:  Having a personal or family history of depression.  Having depression while being pregnant.  Having premenstrual mood issues or mood issues related to oral contraceptives.  Having a lot of life stress.  Having marital conflict.  Lacking a social support network.  Having a baby with special needs.  Having health problems, such as diabetes. What are the signs or  symptoms? Symptoms of baby blues include:  Brief changes in mood, such as going from extreme happiness to sadness.  Decreased concentration.  Difficulty sleeping.  Crying spells, tearfulness.  Irritability.  Anxiety. Symptoms of postpartum depression typically begin within the first month after giving birth. These symptoms include:  Difficulty sleeping or excessive sleepiness.  Marked weight loss.  Agitation.  Feelings of worthlessness.  Lack of interest in activity or food. Postpartum psychosis is a very serious condition and can be dangerous. Fortunately, it is rare. Displaying any of the following symptoms is cause for immediate medical attention. Symptoms of postpartum psychosis include:  Hallucinations and delusions.  Bizarre or disorganized behavior.  Confusion or disorientation. How is this diagnosed? A diagnosis is made by an evaluation of your symptoms. There are no medical or lab tests that lead to a diagnosis, but there are various questionnaires that a health care provider may use to identify those with the baby blues, postpartum depression, or psychosis. Often, a screening tool called the [place] Postnatal Depression Scale is used to diagnose depression in the postpartum period. How is this treated? The baby blues usually goes away on its own in 1-2 weeks. Social support is often all that is needed. You will be encouraged to get adequate sleep and rest. Occasionally, you may be given medicines to help you sleep. Postpartum depression requires treatment because it can last several months or longer if it is not treated. Treatment may include individual or group therapy, medicine, or both to address any social, physiological, and psychological factors that may play a role in the depression. Regular exercise, a  healthy diet, rest, and social support may also be strongly recommended. Postpartum psychosis is more serious and needs treatment right away. Hospitalization is  often needed. Follow these instructions at home:  Get as much rest as you can. Nap when the baby sleeps.  Exercise regularly. Some women find yoga and walking to be beneficial.  Eat a balanced and nourishing diet.  Do little things that you enjoy. Have a cup of tea, take a bubble bath, read your favorite magazine, or listen to your favorite music.  Avoid alcohol.  Ask for help with household chores, cooking, grocery shopping, or running errands as needed. Do not try to do everything.  Talk to people close to you about how you are feeling. Get support from your partner, family members, friends, or other new moms.  Try to stay positive in how you think. Think about the things you are grateful for.  Do not spend a lot of time alone.  Only take over-the-counter or prescription medicine as directed by your health care provider.  Keep all your postpartum appointments.  Let your health care provider know if you have any concerns. Contact a health care provider if: You are having a reaction to or problems with your medicine. Get help right away if:  You have suicidal feelings.  You think you may harm the baby or someone else. This information is not intended to replace advice given to you by your health care provider. Make sure you discuss any questions you have with your health care provider. Document Released: 01/13/2004 Document Revised: 09/16/2015 Document Reviewed: 01/20/2013 Elsevier Interactive Patient Education  2017 [employer] Instructions for Mom Introduction  ACTIVITY  Gradually return to your regular activities.  Let yourself rest. Nap while your baby sleeps.  Avoid lifting anything that is heavier than 10 lb (4.5 kg) until your health care provider says it is okay.  Avoid activities that take a lot of effort and energy (are strenuous) until approved by your health care provider. Walking at a slow-to-moderate pace is usually safe.  If you had a  cesarean delivery:  Do not vacuum, climb stairs, or drive a car for 4-6 weeks.  Have someone help you at home until you feel like you can do your usual activities yourself.  Do exercises as told by your health care provider, if this applies. VAGINAL BLEEDING You may continue to bleed for 4-6 weeks after delivery. Over time, the amount of blood usually decreases and the color of the blood usually gets lighter. However, the flow of bright red blood may increase if you have been too active. If you need to use more than one pad in an hour because your pad gets soaked, or if you pass a large clot:  Lie down.  Raise your feet.  Place a cold compress on your lower abdomen.  Rest.  Call your health care provider. If you are breastfeeding, your period should return anytime between 8 weeks after delivery and the time that you stop breastfeeding. If you are not breastfeeding, your period should return 6-8 weeks after delivery. PERINEAL CARE The perineal area, or perineum, is the part of your body between your thighs. After delivery, this area needs special care. Follow these instructions as told by your health care provider.  Take warm tub baths for 15-20 minutes.  Use medicated pads and pain-relieving sprays and creams as told.  Do not use tampons or douches until vaginal bleeding has stopped.  Each time you go to  the bathroom:  Use a peri bottle.  Change your pad.  Use towelettes in place of toilet paper until your stitches have healed.  Do Kegel exercises every day. Kegel exercises help to maintain the muscles that support the vagina, bladder, and bowels. You can do these exercises while you are standing, sitting, or lying down. To do Kegel exercises:  Tighten the muscles of your abdomen and the muscles that surround your birth canal.  Hold for a few seconds.  Relax.  Repeat until you have done this 5 times in a row.  To prevent hemorrhoids from developing or getting  worse:  Drink enough fluid to keep your urine clear or pale yellow.  Avoid straining when having a bowel movement.  Take over-the-counter medicines and stool softeners as told by your health care provider. BREAST CARE  Wear a tight-fitting bra.  Avoid taking over-the-counter pain medicine for breast discomfort.  Apply ice to the breasts to help with discomfort as needed:  Put ice in a plastic bag.  Place a towel between your skin and the bag.  Leave the ice on for 20 minutes or as told by your health care provider. NUTRITION  Eat a well-balanced diet.  Do not try to lose weight quickly by cutting back on calories.  Take your prenatal vitamins until your postpartum checkup or until your health care provider tells you to stop. POSTPARTUM DEPRESSION You may find yourself crying for no apparent reason and unable to cope with all of the changes that come with having a newborn. This mood is called postpartum depression. Postpartum depression happens because your hormone levels change after delivery. If you have postpartum depression, get support from your partner, friends, and family. If the depression does not go away on its own after several weeks, contact your health care provider. BREAST SELF-EXAM Do a breast self-exam each month, at the same time of the month. If you are breastfeeding, check your breasts just after a feeding, when your breasts are less full. If you are breastfeeding and your period has started, check your breasts on day 5, 6, or 7 of your period. Report any lumps, bumps, or discharge to your health care provider. Know that breasts are normally lumpy if you are breastfeeding. This is temporary, and it is not a health risk. INTIMACY AND SEXUALITY Avoid sexual activity for at least 3-4 weeks after delivery or until the brownish-red vaginal flow is completely gone. If you want to avoid pregnancy, use some form of birth control. You can get pregnant after delivery, even if  you have not had your period. SEEK MEDICAL CARE IF:  You feel unable to cope with the changes that a child brings to your life, and these feelings do not go away after several weeks.  You notice a lump, a bump, or discharge on your breast. SEEK IMMEDIATE MEDICAL CARE IF:  Blood soaks your pad in 1 hour or less.  You have:  Severe pain or cramping in your lower abdomen.  A bad-smelling vaginal discharge.  A fever that is not controlled by medicine.  A fever, and an area of your breast is red and sore.  Pain or redness in your calf.  Sudden, severe chest pain.  Shortness of breath.  Painful or bloody urination.  Problems with your vision.  You vomit for 12 hours or longer.  You develop a severe headache.  You have serious thoughts about hurting yourself, your child, or anyone else. This information is not intended to  replace advice given to you by your health care provider. Make sure you discuss any questions you have with your health care provider. Document Released: 04/07/2000 Document Revised: 09/16/2015 Document Reviewed: 10/12/2014  2017 Elsevier Breastfeeding and Mastitis Mastitis is inflammation of the breast tissue. It can occur in women who are breastfeeding. This can make breastfeeding painful. Mastitis will sometimes go away on its own. Your health care provider will help determine if treatment is needed. CAUSES Mastitis is often associated with a blocked milk (lactiferous) duct. This can happen when too much milk builds up in the breast. Causes of excess milk in the breast can include:  Poor latch-on. If your baby is not latched onto the breast properly, she or he may not empty your breast completely while breastfeeding.  Allowing too much time to pass between feedings.  Wearing a bra or other clothing that is too tight. This puts extra pressure on the lactiferous ducts so milk does not flow through them as it should. Mastitis can also be caused by a  bacterial infection. Bacteria may enter the breast tissue through cuts or openings in the skin. In women who are breastfeeding, this may occur because of cracked or irritated skin. Cracks in the skin are often caused when your baby does not latch on properly to the breast. SIGNS AND SYMPTOMS  Swelling, redness, tenderness, and pain in an area of the breast.  Swelling of the glands under the arm on the same side.  Fever may or may not accompany mastitis. If an infection is allowed to progress, a collection of pus (abscess) may develop. DIAGNOSIS  Your health care provider can usually diagnose mastitis based on your symptoms and a physical exam. Tests may be done to help confirm the diagnosis. These may include:  Removal of pus from the breast by applying pressure to the area. This pus can be examined in the lab to determine which bacteria are present. If an abscess has developed, the fluid in the abscess can be removed with a needle. This can also be used to confirm the diagnosis and determine the bacteria present. In most cases, pus will not be present.  Blood tests to determine if your body is fighting a bacterial infection.  Mammogram or ultrasound tests to rule out other problems or diseases. TREATMENT  Mastitis that occurs with breastfeeding will sometimes go away on its own. Your health care provider may choose to wait 24 hours after first seeing you to decide whether a prescription medicine is needed. If your symptoms are worse after 24 hours, your health care provider will likely prescribe an antibiotic medicine to treat the mastitis. He or she will determine which bacteria are most likely causing the infection and will then select an appropriate antibiotic medicine. This is sometimes changed based on the results of tests performed to identify the bacteria, or if there is no response to the antibiotic medicine selected. Antibiotic medicines are usually given by mouth. You may also be given  medicine for pain. HOME CARE INSTRUCTIONS  Only take over-the-counter or prescription medicines for pain, fever, or discomfort as directed by your health care provider.  If your health care provider prescribed an antibiotic medicine, take the medicine as directed. Make sure you finish it even if you start to feel better.  Do not wear a tight or underwire bra. Wear a soft, supportive bra.  Increase your fluid intake, especially if you have a fever.  Continue to empty the breast. Your health care provider  can tell you whether this milk is safe for your infant or needs to be thrown out. You may be told to stop nursing until your health care provider thinks it is safe for your baby. Use a breast pump if you are advised to stop nursing.  Keep your nipples clean and dry.  Empty the first breast completely before going to the other breast. If your baby is not emptying your breasts completely for some reason, use a breast pump to empty your breasts.  If you go back to work, pump your breasts while at work to stay in time with your nursing schedule.  Avoid allowing your breasts to become overly filled with milk (engorged). SEEK MEDICAL CARE IF:  You have pus-like discharge from the breast.  Your symptoms do not improve with the treatment prescribed by your health care provider within 2 days. SEEK IMMEDIATE MEDICAL CARE IF:  Your pain and swelling are getting worse.  You have pain that is not controlled with medicine.  You have a red line extending from the breast toward your armpit.  You have a fever or persistent symptoms for more than 2-3 days.  You have a fever and your symptoms suddenly get worse. MAKE SURE YOU:   Understand these instructions.  Will watch your condition.  Will get help right away if you are not doing well or get worse. This information is not intended to replace advice given to you by your health care provider. Make sure you discuss any questions you have with  your health care provider. Document Released: 08/05/2004 Document Revised: 04/15/2013 Document Reviewed: 11/14/2012 Elsevier Interactive Patient Education  2017 [employer]. Breastfeeding Deciding to breastfeed is one of the best choices you can make for you and your baby. A change in hormones during pregnancy causes your breast tissue to grow and increases the number and size of your milk ducts. These hormones also allow proteins, sugars, and fats from your blood supply to make breast milk in your milk-producing glands. Hormones prevent breast milk from being released before your baby is born as well as prompt milk flow after birth. Once breastfeeding has begun, thoughts of your baby, as well as his or her sucking or crying, can stimulate the release of milk from your milk-producing glands. Benefits of breastfeeding For Your Baby  Your first milk (colostrum) helps your baby's digestive system function better.  There are antibodies in your milk that help your baby fight off infections.  Your baby has a lower incidence of asthma, allergies, and sudden infant death syndrome.  The nutrients in breast milk are better for your baby than infant formulas and are designed uniquely for your babys needs.  Breast milk improves your baby's brain development.  Your baby is less likely to develop other conditions, such as childhood obesity, asthma, or type 2 diabetes mellitus. For You  Breastfeeding helps to create a very special bond between you and your baby.  Breastfeeding is convenient. Breast milk is always available at the correct temperature and costs nothing.  Breastfeeding helps to burn calories and helps you lose the weight gained during pregnancy.  Breastfeeding makes your uterus contract to its prepregnancy size faster and slows bleeding (lochia) after you give birth.  Breastfeeding helps to lower your risk of developing type 2 diabetes mellitus, osteoporosis, and breast or ovarian  cancer later in life. Signs that your baby is hungry Early Signs of Hunger  Increased alertness or activity.  Stretching.  Movement of the head from  side to side.  Movement of the head and opening of the mouth when the corner of the mouth or cheek is stroked (rooting).  Increased sucking sounds, smacking lips, cooing, sighing, or squeaking.  Hand-to-mouth movements.  Increased sucking of fingers or hands. Late Signs of Hunger  Fussing.  Intermittent crying. Extreme Signs of Hunger  Signs of extreme hunger will require calming and consoling before your baby will be able to breastfeed successfully. Do not wait for the following signs of extreme hunger to occur before you initiate breastfeeding:  Restlessness.  A loud, strong cry.  Screaming. Breastfeeding basics  Breastfeeding Initiation  Find a comfortable place to sit or lie down, with your neck and back well supported.  Place a pillow or rolled up blanket under your baby to bring him or her to the level of your breast (if you are seated). Nursing pillows are specially designed to help support your arms and your baby while you breastfeed.  Make sure that your baby's abdomen is facing your abdomen.  Gently massage your breast. With your fingertips, massage from your chest wall toward your nipple in a circular motion. This encourages milk flow. You may need to continue this action during the feeding if your milk flows slowly.  Support your breast with 4 fingers underneath and your thumb above your nipple. Make sure your fingers are well away from your nipple and your babys mouth.  Stroke your baby's lips gently with your finger or nipple.  When your baby's mouth is open wide enough, quickly bring your baby to your breast, placing your entire nipple and as much of the colored area around your nipple (areola) as possible into your baby's mouth.  More areola should be visible above your baby's upper lip than below the lower  lip.  Your baby's tongue should be between his or her lower gum and your breast.  Ensure that your baby's mouth is correctly positioned around your nipple (latched). Your baby's lips should create a seal on your breast and be turned out (everted).  It is common for your baby to suck about 2-3 minutes in order to start the flow of breast milk. Latching  Teaching your baby how to latch on to your breast properly is very important. An improper latch can cause nipple pain and decreased milk supply for you and poor weight gain in your baby. Also, if your baby is not latched onto your nipple properly, he or she may swallow some air during feeding. This can make your baby fussy. Burping your baby when you switch breasts during the feeding can help to get rid of the air. However, teaching your baby to latch on properly is still the best way to prevent fussiness from swallowing air while breastfeeding. Signs that your baby has successfully latched on to your nipple:  Silent tugging or silent sucking, without causing you pain.  Swallowing heard between every 3-4 sucks.  Muscle movement above and in front of his or her ears while sucking. Signs that your baby has not successfully latched on to nipple:  Sucking sounds or smacking sounds from your baby while breastfeeding.  Nipple pain. If you think your baby has not latched on correctly, slip your finger into the corner of your babys mouth to break the suction and place it between your baby's gums. Attempt breastfeeding initiation again. Signs of Successful Breastfeeding  Signs from your baby:  A gradual decrease in the number of sucks or complete cessation of sucking.  Falling  asleep.  Relaxation of his or her body.  Retention of a small amount of milk in his or her mouth.  Letting go of your breast by himself or herself. Signs from you:  Breasts that have increased in firmness, weight, and size 1-3 hours after feeding.  Breasts that are  softer immediately after breastfeeding.  Increased milk volume, as well as a change in milk consistency and color by the fifth day of breastfeeding.  Nipples that are not sore, cracked, or bleeding. Signs That Your [employer] is Getting Enough Milk  Wetting at least 1-2 diapers during the first 24 hours after birth.  Wetting at least 5-6 diapers every 24 hours for the first week after birth. The urine should be clear or pale yellow by 5 days after birth.  Wetting 6-8 diapers every 24 hours as your baby continues to grow and develop.  At least 3 stools in a 24-hour period by age 35 days. The stool should be soft and yellow.  At least 3 stools in a 24-hour period by age 82 days. The stool should be seedy and yellow.  No loss of weight greater than 10% of birth weight during the first 60 days of age.  Average weight gain of 4-7 ounces (113-198 g) per week after age 2 days.  Consistent daily weight gain by age 28 days, without weight loss after the age of 2 weeks. After a feeding, your baby may spit up a small amount. This is common. Breastfeeding frequency and duration Frequent feeding will help you make more milk and can prevent sore nipples and breast engorgement. Breastfeed when you feel the need to reduce the fullness of your breasts or when your baby shows signs of hunger. This is called "breastfeeding on demand." Avoid introducing a pacifier to your baby while you are working to establish breastfeeding (the first 4-6 weeks after your baby is born). After this time you may choose to use a pacifier. Research has shown that pacifier use during the first year of a baby's life decreases the risk of sudden infant death syndrome (SIDS). Allow your baby to feed on each breast as long as he or she wants. Breastfeed until your baby is finished feeding. When your baby unlatches or falls asleep while feeding from the first breast, offer the second breast. Because newborns are often sleepy in the first few  weeks of life, you may need to awaken your baby to get him or her to feed. Breastfeeding times will vary from baby to baby. However, the following rules can serve as a guide to help you ensure that your baby is properly fed:  Newborns (babies 20 weeks of age or younger) may breastfeed every 1-3 hours.  Newborns should not go longer than 3 hours during the day or 5 hours during the night without breastfeeding.  You should breastfeed your baby a minimum of 8 times in a 24-hour period until you begin to introduce solid foods to your baby at around 58 months of age. Breast milk pumping Pumping and storing breast milk allows you to ensure that your baby is exclusively fed your breast milk, even at times when you are unable to breastfeed. This is especially important if you are going back to work while you are still breastfeeding or when you are not able to be present during feedings. Your lactation consultant can give you guidelines on how long it is safe to store breast milk. A breast pump is a machine that allows you to  pump milk from your breast into a sterile bottle. The pumped breast milk can then be stored in a refrigerator or freezer. Some breast pumps are operated by hand, while others use electricity. Ask your lactation consultant which type will work best for you. Breast pumps can be purchased, but some hospitals and breastfeeding support groups lease breast pumps on a monthly basis. A lactation consultant can teach you how to hand express breast milk, if you prefer not to use a pump. Caring for your breasts while you breastfeed Nipples can become dry, cracked, and sore while breastfeeding. The following recommendations can help keep your breasts moisturized and healthy:  Avoid using soap on your nipples.  Wear a supportive bra. Although not required, special nursing bras and tank tops are designed to allow access to your breasts for breastfeeding without taking off your entire bra or top. Avoid  wearing underwire-style bras or extremely tight bras.  Air dry your nipples for 3-82minutes after each feeding.  Use only cotton bra pads to absorb leaked breast milk. Leaking of breast milk between feedings is normal.  Use lanolin on your nipples after breastfeeding. Lanolin helps to maintain your skin's normal moisture barrier. If you use pure lanolin, you do not need to wash it off before feeding your baby again. Pure lanolin is not toxic to your baby. You may also hand express a few drops of breast milk and gently massage that milk into your nipples and allow the milk to air dry. In the first few weeks after giving birth, some women experience extremely full breasts (engorgement). Engorgement can make your breasts feel heavy, warm, and tender to the touch. Engorgement peaks within 3-5 days after you give birth. The following recommendations can help ease engorgement:  Completely empty your breasts while breastfeeding or pumping. You may want to start by applying warm, moist heat (in the shower or with warm water-soaked hand towels) just before feeding or pumping. This increases circulation and helps the milk flow. If your baby does not completely empty your breasts while breastfeeding, pump any extra milk after he or she is finished.  Wear a snug bra (nursing or regular) or tank top for 1-2 days to signal your body to slightly decrease milk production.  Apply ice packs to your breasts, unless this is too uncomfortable for you.  Make sure that your baby is latched on and positioned properly while breastfeeding. If engorgement persists after 48 hours of following these recommendations, contact your health care provider or a Science writer. Overall health care recommendations while breastfeeding  Eat healthy foods. Alternate between meals and snacks, eating 3 of each per day. Because what you eat affects your breast milk, some of the foods may make your baby more irritable than usual. Avoid  eating these foods if you are sure that they are negatively affecting your baby.  Drink milk, fruit juice, and water to satisfy your thirst (about 10 glasses a day).  Rest often, relax, and continue to take your prenatal vitamins to prevent fatigue, stress, and anemia.  Continue breast self-awareness checks.  Avoid chewing and smoking tobacco. Chemicals from cigarettes that pass into breast milk and exposure to secondhand smoke may harm your baby.  Avoid alcohol and drug use, including marijuana. Some medicines that may be harmful to your baby can pass through breast milk. It is important to ask your health care provider before taking any medicine, including all over-the-counter and prescription medicine as well as vitamin and herbal supplements. It is  possible to become pregnant while breastfeeding. If birth control is desired, ask your health care provider about options that will be safe for your baby. Contact a health care provider if:  You feel like you want to stop breastfeeding or have become frustrated with breastfeeding.  You have painful breasts or nipples.  Your nipples are cracked or bleeding.  Your breasts are red, tender, or warm.  You have a swollen area on either breast.  You have a fever or chills.  You have nausea or vomiting.  You have drainage other than breast milk from your nipples.  Your breasts do not become full before feedings by the fifth day after you give birth.  You feel sad and depressed.  Your baby is too sleepy to eat well.  Your baby is having trouble sleeping.  Your baby is wetting less than 3 diapers in a 24-hour period.  Your baby has less than 3 stools in a 24-hour period.  Your baby's skin or the white part of his or her eyes becomes yellow.  Your baby is not gaining weight by 76 days of age. Get help right away if:  Your baby is overly tired (lethargic) and does not want to wake up and feed.  Your baby develops an unexplained  fever. This information is not intended to replace advice given to you by your health care provider. Make sure you discuss any questions you have with your health care provider. Document Released: 04/10/2005 Document Revised: 09/22/2015 Document Reviewed: 10/02/2012 Elsevier Interactive Patient Education  2017 [employer] Tips If you are breastfeeding, there may be times when you cannot feed your baby directly. Returning to work or going on a trip are common examples. Pumping allows you to store breast milk and feed it to your baby later. You may not get much milk when you first start to pump. Your breasts should start to make more after a few days. If you pump at the times you usually feed your baby, you may be able to keep making enough milk to feed your baby without also using formula. The more often you pump, the more milk you will produce. When should I pump?  You can begin to pump soon after delivery. However, some experts recommend waiting about 4 weeks before giving your infant a bottle to make sure breastfeeding is going well.  If you plan to return to work, begin pumping a few weeks before. This will help you develop techniques that work best for you. It also lets you build up a supply of breast milk.  When you are with your infant, feed on demand and pump after each feeding.  When you are away from your infant for several hours, pump for about 15 minutes every 2-3 hours. Pump both breasts at the same time if you can.  If your infant has a formula feeding, make sure to pump around the same time.  If you drink any alcohol, wait 2 hours before pumping. How do I prepare to pump? Your let-down reflexis the natural reaction to stimulation that makes your breast milk flow. It is easier to stimulate this reflex when you are relaxed. Find relaxation techniques that work for you. If you have difficulty with your let-down reflex, try these methods:  Smell one of your  infant's blankets or an item of clothing.  Look at a picture or video of your infant.  Sit in a quiet, private space.  Massage the breast you plan to pump.  Place soothing warmth on the breast.  Play relaxing music. What are some general breast pumping tips?  Wash your hands before you pump. You do not need to wash your nipples or breasts.  There are three ways to pump.  You can use your hand to massage and compress your breast.  You can use a handheld manual pump.  You can use an electric pump.  Make sure the suction cup (flange) on the breast pump is the right size. Place the flange directly over the nipple. If it is the wrong size or placed the wrong way, it may be painful and cause nipple damage.  If pumping is uncomfortable, apply a small amount of purified or modified lanolin to your nipple and areola.  If you are using an electric pump, adjust the speed and suction power to be more comfortable.  If pumping is painful or if you are not getting very much milk, you may need a different type of pump. A lactation consultant can help you determine what type of pump to use.  Keep a full water bottle near you at all times. Drinking lots of fluid helps you make more milk.  You can store your milk to use later. Pumped breast milk can be stored in a sealable, sterile container or plastic bag. Label all stored breast milk with the date you pumped it.  Milk can stay out at room temperature for up to 8 hours.  You can store your milk in the refrigerator for up to 8 days.  You can store your milk in the freezer for 3 months. Thaw frozen milk using warm water. Do not put it in the microwave.  Do not smoke. Smoking can lower your milk supply and harm your infant. If you need help quitting, ask your health care provider to recommend a program. When should I call my health care provider or a lactation consultant?  You are having trouble pumping.  You are concerned that you are not  making enough milk.  You have nipple pain, soreness, or redness.  You want to use birth control. Birth control pills may lower your milk supply. Talk to your health care provider about your options. This information is not intended to replace advice given to you by your health care provider. Make sure you discuss any questions you have with your health care provider. Document Released: 09/28/2009 Document Revised: 09/22/2015 Document Reviewed: 01/31/2013 Elsevier Interactive Patient Education  2017 [employer] Breast Engorgement Breast engorgement is the overfilling of your breasts with breast milk. In the first few weeks after giving birth, you may experience breast engorgement. Although it is normal for your breasts to feel heavy, full, and uncomfortable within 3-5 days of giving birth, breast engorgement can make your breasts throb and feel hard, tightly stretched, warm, and tender. Engorgement peaks about the fifth day after you give birth. Breast engorgement can be easily treated and does not require you to stop breastfeeding. What are the causes? Some women delay feedings because of sore or cracked nipples, which can lead to engorgement. Cracked and sore nipples often are caused by inadequate latching (when your baby's mouth attaches to your breast to breastfeed). If your baby is latched on properly, he or she should be able to breastfeed as long as needed, without causing any pain. If you do feel pain while breastfeeding, take your baby off your breast and try again. Get help from your health care provider or a lactation consultant if you continue to  have pain. Other causes of engorgement include:  Improper position of your baby while breastfeeding.  Allowing too much time to pass between feedings.  Reduction in breastfeeding because you give your baby water, juice, formula, breast milk from a bottle, or a pacifier instead of breastfeeding.  Changes in your babys feeding  patterns.  Weak sucking from your baby, which causes less milk to be taken out of your breast during feedings.  Fatigue, stress, anemia.  Plugged milk ducts.  A history of breast surgery. What are the signs or symptoms? If your breasts become engorged, you may experience:  Breast swelling, tenderness, warmth, redness, or throbbing.  Breast hardness and stretching of the skin around your breast.  Flattening, tightening, and hardening of your nipple.  A low-grade fever, which can be confused with a breast infection. How is this treated? Breast engorgement should improve in 24-48 hours after following these recommendations:  Breastfeed when you feel the need to reduce the fullness of your breasts or when your baby shows signs of hunger. This is called "breastfeeding on demand."  Newborns (babies younger than 4 weeks) often breastfeed every 1-3 hours during the day. You may need to awaken your baby to feed if he or she is asleep at a feeding time.  Do not allow your baby to sleep longer than 5 hours during the night without a feeding.  Pump or hand-express breast milk before breastfeeding to soften your breast, areola, and nipple.  Apply warm, moist heat (in the shower or with warm water-soaked hand towels) just before feeding or pumping, or massage your breast before or during breastfeeding. This increases circulation and helps your milk to flow.  Completely empty your breasts when breastfeeding or pumping. Afterward, wear a snug bra (nursing or regular) or tank top for 1-2 days to signal your body to slightly decrease milk production. Only wear snug bras or tank tops to treat engorgement. Tight bras typically should be avoided by breastfeeding mothers. Once engorgement is relieved, return to wearing regular, loose-fitting clothes.  Apply ice packs to your breasts to lessen the pain from engorgement and relieve swelling, unless the ice is uncomfortable for you.  Do not delay  feedings. Try to relax when it is time to feed your baby. This helps to trigger your "let-down reflex," which releases milk from your breast.  Ensure your baby is latched on to your breast and positioned properly while breastfeeding.  Allow your baby to remain at your breast as long as he or she is latched on well and actively sucking. Your baby will let you know when he or she is done breastfeeding by pulling away from your breast or falling asleep.  Avoid introducing bottles or pacifiers to your baby in the early weeks of breastfeeding. Wait to introduce these things until after resolving any breastfeeding challenges.  Try to pump your milk on the same schedule as when your baby would breastfeed if you are returning to work or away from home for an extended period.  Drink plenty of fluids to avoid dehydration, which can eventually put you at greater risk of breast engorgement. Contact a health care provider if:  Engorgement lasts longer than 2 days, even after treatment.  You have flu-like symptoms, such as a fever, chills, or body aches.  Your breasts become increasingly red and painful. This information is not intended to replace advice given to you by your health care provider. Make sure you discuss any questions you have with your health care  provider. Document Released: 08/05/2004 Document Revised: 09/22/2015 Document Reviewed: 10/03/2012 Elsevier Interactive Patient Education  2017 [employer].

## 2016-05-28 NOTE — Progress Notes (Signed)
Patient ID: RUNELL KITCHINGS, female   DOB: Aug 25, 1985, 31 y.o.   MRN: [id number] PPD # 2 SVD with 2nd Degree Perineal Laceration  S:  Reports feeling well.             Tolerating po/ No nausea or vomiting             Bleeding is light             Pain controlled with ibuprofen (OTC)             Up ad lib / ambulatory / voiding without difficulties     Information for the patient's newborn:  Keerti, Rocker Girl Barbie [id number]  female "Scarlett"   breast feeding    O:  A & O x 3, in no apparent distress              VS:  Vitals:   05/26/16 2302 05/27/16 0003 05/27/16 0402 05/27/16 1834  BP: (!) 101/59 101/62 (!) 102/55 115/71  Pulse: (!) 104 99 98 98  Resp: 20 18 18 16   Temp: 99.7 F (37.6 C) 98.6 F (37 C) 97.7 F (36.5 C) 99.1 F (37.3 C)  TempSrc: Oral Oral Oral Oral  SpO2: 99% 100% 98% 100%  Weight:      Height:        LABS:   Recent Labs  05/26/16 1421 05/27/16 0534  WBC 8.2 12.5*  HGB 13.6 10.7*  HCT 38.5 29.5*  PLT 186 166    Blood type: --/--/O POS (02/02 1420)  Rubella: Immune (07/03 0000)   I&O: I/O last 3 completed shifts: In: -  Out: 500 [Urine:200; Blood:300]          No intake/output data recorded.    Abdomen: soft, non-tender, non-distended             Fundus: firm, non-tender, U-1  Perineum: 2nd degree repair healing well, no edema  Lochia: minimal  Extremities: Trace edema, no calf pain or tenderness    A/P: PPD # 2 31 y.o., [id number]   Principal Problem:   Postpartum care following vaginal delivery (2/2) Active Problems:   Indication for care in labor or delivery   SVD (spontaneous vaginal delivery)    Doing well - stable status  Routine post partum orders  D/C home today    Laury Deep, M, MSN, CNM 05/28/2016, 8:24 AM

## 2016-05-30 ENCOUNTER — Inpatient Hospital Stay (HOSPITAL_COMMUNITY): Admission: RE | Admit: 2016-05-30 | Payer: BLUE CROSS/BLUE SHIELD | Source: Ambulatory Visit

## 2017-01-08 ENCOUNTER — Ambulatory Visit (INDEPENDENT_AMBULATORY_CARE_PROVIDER_SITE_OTHER): Payer: BLUE CROSS/BLUE SHIELD | Admitting: Orthopaedic Surgery

## 2017-01-08 ENCOUNTER — Encounter (INDEPENDENT_AMBULATORY_CARE_PROVIDER_SITE_OTHER): Payer: Self-pay | Admitting: Orthopaedic Surgery

## 2017-01-08 DIAGNOSIS — M654 Radial styloid tenosynovitis [de Quervain]: Secondary | ICD-10-CM | POA: Insufficient documentation

## 2017-01-08 MED ORDER — LIDOCAINE HCL 1 % IJ SOLN
1.0000 mL | INTRAMUSCULAR | Status: AC | PRN
  Administered 2017-01-08: 1 mL

## 2017-01-08 MED ORDER — METHYLPREDNISOLONE ACETATE 40 MG/ML IJ SUSP
40.0000 mg | INTRAMUSCULAR | Status: AC | PRN
  Administered 2017-01-08: 40 mg

## 2017-01-08 NOTE — Progress Notes (Signed)
Office Visit Note   Patient: Wendy Clements           Date of Birth: Dec 08, 1985           MRN: [id number] Visit Date: 01/08/2017              Requested by: No referring provider defined for this encounter. PCP: Wayland Salinas, MD   Assessment & Plan: Visit Diagnoses:  1. De Quervain's disease (tenosynovitis)     Plan: Her signs and symptoms are consistent with de Quervain's tenosynovitis. I actually showed her an Internet website they can show her stretching exercises to try. We also offered her steroid injection which she agreed to and I expanded the risk and benefits of steroid injections. She tolerated it well. She is to work on activity modification as well follow up as needed. All questions were encouraged and answered.  Follow-Up Instructions: Return if symptoms worsen or fail to improve.   Orders:  No orders of the defined types were placed in this encounter.  No orders of the defined types were placed in this encounter.     Procedures: Hand/UE Inj Date/Time: 01/08/2017 5:31 PM Performed by: Mcarthur Rossetti Authorized by: Mcarthur Rossetti   Condition: de Quervain's   Medications:  1 mL lidocaine 1 %; 40 mg methylPREDNISolone acetate 40 MG/ML     Clinical Data: No additional findings.   Subjective: Chief Complaint  Patient presents with  . Left Wrist - Pain  The patient is a right-hand-dominant female that seen before comes in with acute left wrist pain. Is been really getting worse over the last few months while she's been taking care of a newborn daughter. She says it hurts while she is holding the child and caring her diaper bag as well as caring the stroller in car seat. It hurts during breast-feeding as well. She hurts along the radial styloid of the wrist was were she points to. She denies a nubs and tingling. 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  HPI  Review of Systems She  denies any headache chest pain shortness of breath, fever, chills, nausea, vomiting.  Objective: Vital Signs: There were no vitals taken for this visit.  Physical Exam He is alert or 3 and in no acute distress. Ortho Exam On examination of her left wrist she does have a positive Finkelstein test with severe pain. She has pain over the radial styloid. The remainder of her wrist and exam are normal. Specialty Comments:  No specialty comments available.  Imaging: No results found.   PMFS History: Patient Active Problem List   Diagnosis Date Noted  . De Quervain's disease (tenosynovitis) 01/08/2017  . Indication for care in labor or delivery 05/26/2016  . SVD (spontaneous vaginal delivery) 05/26/2016  . Postpartum care following vaginal delivery (2/2) 05/26/2016   Past Medical History:  Diagnosis Date  . Hx of varicella   . Hypothyroidism   . OCD (obsessive compulsive disorder)   . Vaginal Pap smear, abnormal     Family History  Problem Relation Age of Onset  . Cancer Maternal Grandmother     Past Surgical History:  Procedure Laterality Date  . LEEP     Social History   Occupational History  . Not on file.   Social History Main Topics  . Smoking status: Never Smoker  . Smokeless tobacco: Never Used  . Alcohol use No  . Drug use: No  .  Sexual activity: Yes    Birth control/ protection: Pill

## 2018-01-28 DIAGNOSIS — Z01419 Encounter for gynecological examination (general) (routine) without abnormal findings: Secondary | ICD-10-CM | POA: Diagnosis not present

## 2018-01-28 DIAGNOSIS — Z6825 Body mass index (BMI) 25.0-25.9, adult: Secondary | ICD-10-CM | POA: Diagnosis not present

## 2018-07-01 ENCOUNTER — Encounter (INDEPENDENT_AMBULATORY_CARE_PROVIDER_SITE_OTHER): Payer: Self-pay | Admitting: Orthopaedic Surgery

## 2018-07-01 ENCOUNTER — Ambulatory Visit (INDEPENDENT_AMBULATORY_CARE_PROVIDER_SITE_OTHER): Payer: Self-pay

## 2018-07-01 ENCOUNTER — Ambulatory Visit (INDEPENDENT_AMBULATORY_CARE_PROVIDER_SITE_OTHER): Payer: BLUE CROSS/BLUE SHIELD | Admitting: Physician Assistant

## 2018-07-01 DIAGNOSIS — G8929 Other chronic pain: Secondary | ICD-10-CM

## 2018-07-01 DIAGNOSIS — M25561 Pain in right knee: Secondary | ICD-10-CM

## 2018-07-01 MED ORDER — METHYLPREDNISOLONE ACETATE 40 MG/ML IJ SUSP
40.0000 mg | INTRAMUSCULAR | Status: AC | PRN
  Administered 2018-07-01: 40 mg via INTRA_ARTICULAR

## 2018-07-01 MED ORDER — LIDOCAINE HCL 1 % IJ SOLN
3.0000 mL | INTRAMUSCULAR | Status: AC | PRN
  Administered 2018-07-01: 3 mL

## 2018-07-01 NOTE — Progress Notes (Signed)
Office Visit Note   Patient: Wendy Clements           Date of Birth: 1986-01-16           MRN: [id number] Visit Date: 07/01/2018              Requested by: Wayland Salinas, MD [address] [place], [place] [address] PCP: Wayland Salinas, MD   Assessment & Plan: Visit Diagnoses:  1. Chronic pain of right knee     Plan: Discussed with her knee friendly exercises.  She will follow-up with [place] pain persist or becomes worse.  Questions encouraged and answered.  Follow-Up Instructions: Return if symptoms worsen or fail to improve.   Orders:  Orders Placed This Encounter  Procedures  . Large Joint Inj  . XR Knee 1-2 Views Right   No orders of the defined types were placed in this encounter.     Procedures: Large Joint Inj on 07/01/2018 5:24 PM Indications: pain Details: 22 G 1.5 in needle, anterolateral approach  Arthrogram: No  Medications: 3 mL lidocaine 1 %; 40 mg methylPREDNISolone acetate 40 MG/ML Outcome: tolerated well, no immediate complications Procedure, treatment alternatives, risks and benefits explained, specific risks discussed. Consent was given by the patient. Immediately prior to procedure a time out was called to verify the correct patient, procedure, equipment, support staff and site/side marked as required. Patient was prepped and draped in the usual sterile fashion.       Clinical Data: No additional findings.   Subjective: Chief Complaint  Patient presents with  . Right Knee - Pain    HPI Wendy Clements is a 33 year old female comes into today for recurrent right knee pain.  She was last seen in 2017 for right knee pain time was given cortisone injection.  She is asking for repeat cortisone injection.  She said no acute injury to the knee.  She denies any mechanical symptoms in the knee.  Pains mostly on the medial aspect of the knee.  Pain is worse whenever she works out and is walking for exercise. Review of  Systems See HPI  Objective: Vital Signs: There were no vitals taken for this visit.  Physical Exam Constitutional:      Appearance: She is not ill-appearing or diaphoretic.  Pulmonary:     Effort: Pulmonary effort is normal.  Neurological:     Mental Status: She is alert and oriented to person, place, and time.  Psychiatric:        Mood and Affect: Mood normal.        Behavior: Behavior normal.     Ortho Exam Bilateral knees good range of motion.  No instability valgus varus stressing.  Tenderness along the medial joint line of the right knee.  No effusion abnormal warmth or erythema. Specialty Comments:  No specialty comments available.  Imaging: Xr Knee 1-2 Views Right  Result Date: 07/01/2018 Right knee AP and lateral views: No acute fracture.  Narrowing the medial joint line with varus deformity.  Otherwise knee appears well-preserved.    PMFS History: Patient Active Problem List   Diagnosis Date Noted  . De Quervain's disease (tenosynovitis) 01/08/2017  . Indication for care in labor or delivery 05/26/2016  . SVD (spontaneous vaginal delivery) 05/26/2016  . Postpartum care following vaginal delivery (2/2) 05/26/2016   Past Medical History:  Diagnosis Date  . Hx of varicella   . Hypothyroidism   . OCD (obsessive compulsive disorder)   . Vaginal Pap smear,  abnormal     Family History  Problem Relation Age of Onset  . Cancer Maternal Grandmother     Past Surgical History:  Procedure Laterality Date  . LEEP     Social History   Occupational History  . Not on file  Tobacco Use  . Smoking status: Never Smoker  . Smokeless tobacco: Never Used  Substance and Sexual Activity  . Alcohol use: No  . Drug use: No  . Sexual activity: Yes    Birth control/protection: Pill

## 2018-08-20 DIAGNOSIS — Z3041 Encounter for surveillance of contraceptive pills: Secondary | ICD-10-CM | POA: Diagnosis not present

## 2018-08-20 DIAGNOSIS — T384X5A Adverse effect of oral contraceptives, initial encounter: Secondary | ICD-10-CM | POA: Diagnosis not present

## 2019-02-10 DIAGNOSIS — Z3200 Encounter for pregnancy test, result unknown: Secondary | ICD-10-CM | POA: Diagnosis not present

## 2019-02-10 DIAGNOSIS — N926 Irregular menstruation, unspecified: Secondary | ICD-10-CM | POA: Diagnosis not present

## 2019-02-10 DIAGNOSIS — Z01419 Encounter for gynecological examination (general) (routine) without abnormal findings: Secondary | ICD-10-CM | POA: Diagnosis not present

## 2019-02-10 DIAGNOSIS — Z124 Encounter for screening for malignant neoplasm of cervix: Secondary | ICD-10-CM | POA: Diagnosis not present

## 2019-02-10 DIAGNOSIS — N898 Other specified noninflammatory disorders of vagina: Secondary | ICD-10-CM | POA: Diagnosis not present

## 2019-02-10 DIAGNOSIS — Z118 Encounter for screening for other infectious and parasitic diseases: Secondary | ICD-10-CM | POA: Diagnosis not present

## 2019-02-10 DIAGNOSIS — Z6825 Body mass index (BMI) 25.0-25.9, adult: Secondary | ICD-10-CM | POA: Diagnosis not present

## 2019-02-10 DIAGNOSIS — E039 Hypothyroidism, unspecified: Secondary | ICD-10-CM | POA: Diagnosis not present

## 2019-03-10 DIAGNOSIS — F411 Generalized anxiety disorder: Secondary | ICD-10-CM | POA: Diagnosis not present

## 2019-03-10 DIAGNOSIS — Z3041 Encounter for surveillance of contraceptive pills: Secondary | ICD-10-CM | POA: Diagnosis not present

## 2019-08-18 DIAGNOSIS — N644 Mastodynia: Secondary | ICD-10-CM | POA: Diagnosis not present

## 2019-09-18 DIAGNOSIS — N644 Mastodynia: Secondary | ICD-10-CM | POA: Diagnosis not present

## 2019-09-18 DIAGNOSIS — R922 Inconclusive mammogram: Secondary | ICD-10-CM | POA: Diagnosis not present

## 2019-10-30 ENCOUNTER — Ambulatory Visit: Payer: BLUE CROSS/BLUE SHIELD | Admitting: Orthopaedic Surgery

## 2019-10-30 DIAGNOSIS — G8929 Other chronic pain: Secondary | ICD-10-CM | POA: Diagnosis not present

## 2019-10-30 DIAGNOSIS — M25561 Pain in right knee: Secondary | ICD-10-CM | POA: Diagnosis not present

## 2019-10-30 MED ORDER — LIDOCAINE HCL 1 % IJ SOLN
3.0000 mL | INTRAMUSCULAR | Status: AC | PRN
  Administered 2019-10-30: 3 mL

## 2019-10-30 MED ORDER — METHYLPREDNISOLONE ACETATE 40 MG/ML IJ SUSP
40.0000 mg | INTRAMUSCULAR | Status: AC | PRN
  Administered 2019-10-30: 40 mg via INTRA_ARTICULAR

## 2019-10-30 NOTE — Progress Notes (Signed)
Office Visit Note   Patient: Wendy Clements           Date of Birth: September 27, 1985           MRN: [id number] Visit Date: 10/30/2019              Requested by: Wayland Salinas, MD [address] [place],  [place] [address] PCP: Wayland Salinas, MD   Assessment & Plan: Visit Diagnoses:  1. Chronic pain of right knee     Plan: At this point a MRI is warranted of her right knee to rule out a meniscal tear based on her clinical exam and signs and symptoms combined with failure conservative treatment and multiple steroid injections.  Should continue to work on quad strengthening exercises.  We will see her back after the MRI.  Follow-Up Instructions: No follow-ups on file.   Orders:  No orders of the defined types were placed in this encounter.  No orders of the defined types were placed in this encounter.     Procedures: Large Joint Inj: R knee on 10/30/2019 3:49 PM Indications: diagnostic evaluation and pain Details: 22 G 1.5 in needle, superolateral approach  Arthrogram: No  Medications: 3 mL lidocaine 1 %; 40 mg methylPREDNISolone acetate 40 MG/ML Outcome: tolerated well, no immediate complications Procedure, treatment alternatives, risks and benefits explained, specific risks discussed. Consent was given by the patient. Immediately prior to procedure a time out was called to verify the correct patient, procedure, equipment, support staff and site/side marked as required. Patient was prepped and draped in the usual sterile fashion.       Clinical Data: No additional findings.   Subjective: Chief Complaint  Patient presents with  . Right Knee - Pain  The patient is well-known to [place].  She is a 34 year old female who continues to have chronic right knee pain but also symptoms of the knee giving out on her.  This is the third time we have seen her for this.  She is requesting a steroid injection today but also other diagnostic studies that may  help determine what is going on with her right knee.  Injections have helped at least temporize her pain in the past and that right knee.  The last time I saw her was over a year ago.  She is a very active 34 year old female.  She was pushing her stroller quite a bit recently and the knee is really flared up since then.  Again she has symptoms of locking and catching as well.  She is never had surgery on that knee.  She has had no other acute change in her medical status  HPI  Review of Systems .  She currently denies any headache, chest pain, short of breath, fever, chills, nausea, vomiting  Objective: Vital Signs: There were no vitals taken for this visit.  Physical Exam She is alert and orient x3 and in no acute distress Ortho Exam Examination of her left knee is normal examination of her right knee shows pain along the medial joint line and a positive McMurray sign to the medial joint line of the knee. Specialty Comments:  No specialty comments available.  Imaging: No results found. Previous x-rays of her right knee are entirely normal.  PMFS History: Patient Active Problem List   Diagnosis Date Noted  . De Quervain's disease (tenosynovitis) 01/08/2017  . Indication for care in labor or delivery 05/26/2016  . SVD (spontaneous vaginal delivery) 05/26/2016  . Postpartum  care following vaginal delivery (2/2) 05/26/2016   Past Medical History:  Diagnosis Date  . Hx of varicella   . Hypothyroidism   . OCD (obsessive compulsive disorder)   . Vaginal Pap smear, abnormal     Family History  Problem Relation Age of Onset  . Cancer Maternal Grandmother     Past Surgical History:  Procedure Laterality Date  . LEEP     Social History   Occupational History  . Not on file  Tobacco Use  . Smoking status: Never Smoker  . Smokeless tobacco: Never Used  Substance and Sexual Activity  . Alcohol use: No  . Drug use: No  . Sexual activity: Yes    Birth control/protection: Pill

## 2019-12-08 DIAGNOSIS — J4520 Mild intermittent asthma, uncomplicated: Secondary | ICD-10-CM | POA: Diagnosis not present

## 2020-02-11 ENCOUNTER — Other Ambulatory Visit: Payer: Self-pay | Admitting: Orthopaedic Surgery

## 2020-02-11 DIAGNOSIS — G8929 Other chronic pain: Secondary | ICD-10-CM

## 2020-02-11 DIAGNOSIS — M25561 Pain in right knee: Secondary | ICD-10-CM

## 2020-02-12 ENCOUNTER — Telehealth: Payer: Self-pay

## 2020-02-12 NOTE — Telephone Encounter (Signed)
I do not think the MRI has even been done yet.  I do not see any reports in epic.  Once the MRI is done and I have the report I can go over it with her.

## 2020-02-12 NOTE — Telephone Encounter (Signed)
Patient called she is requesting a phone call or virtual call to go over MRI Review results with Dr.Blackman. Call [phone]

## 2020-02-12 NOTE — Telephone Encounter (Signed)
Please advise 

## 2020-02-13 NOTE — Telephone Encounter (Signed)
I called Wendy Clements. She is scheduled for Mri on Sunday. She was advise to call back once MRI is done so that Dr. Ninfa Linden can review. She stated understanding

## 2020-02-15 ENCOUNTER — Ambulatory Visit (INDEPENDENT_AMBULATORY_CARE_PROVIDER_SITE_OTHER): Payer: BC Managed Care – PPO

## 2020-02-15 ENCOUNTER — Other Ambulatory Visit: Payer: Self-pay

## 2020-02-15 DIAGNOSIS — M25561 Pain in right knee: Secondary | ICD-10-CM

## 2020-02-15 DIAGNOSIS — G8929 Other chronic pain: Secondary | ICD-10-CM | POA: Diagnosis not present

## 2020-02-15 IMAGING — MR MR KNEE*R* W/O CM
7 series · 40 of 40 positions shown · non-contrast
Comparison: Plain films of the right knee [DATE].

CLINICAL DATA: Chronic right knee pain which has worsened over the
past 5 months.

EXAM:
MRI OF THE RIGHT KNEE WITHOUT CONTRAST
TECHNIQUE: Multiplanar, multisequence MR imaging of the knee was performed. No
intravenous contrast was administered.

[Series 3: T2 fat-sat · axial · 4.0mm · 0.53mm/px · z∈[-78,+92]mm · 7 of 35 slices shown (1 of 3)]
[im 1/35]
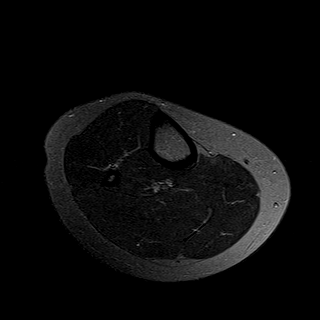
[im 6/35]
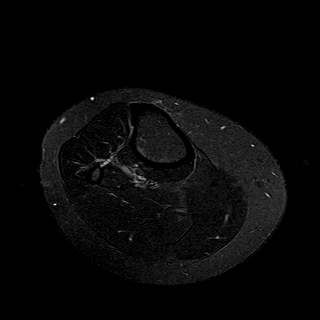
[im 12/35]
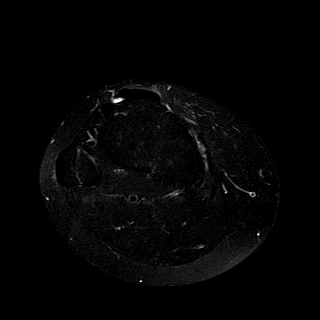
[im 18/35]
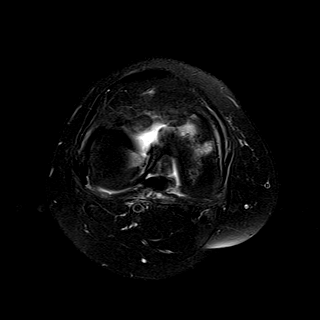
[im 23/35]
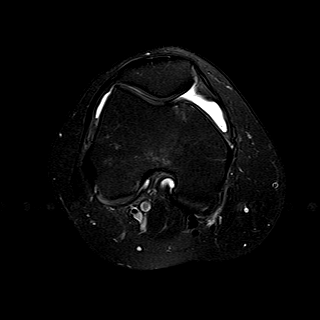
[im 29/35]
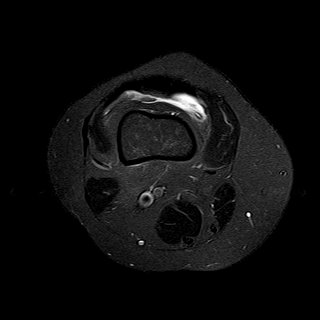
[im 35/35]
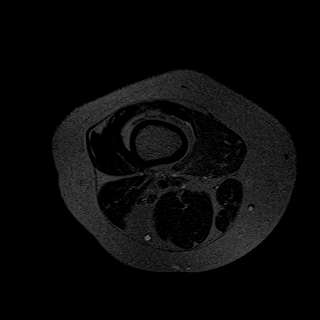

[Series 4: T1 · coronal · 4.0mm · 0.62mm/px · 5 of 26 slices shown]
[im 1/26]
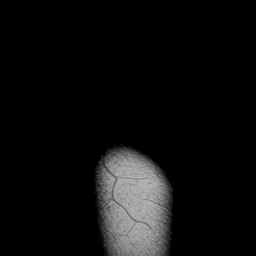
[im 7/26]
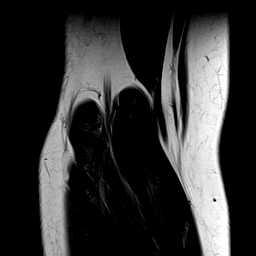
[im 13/26]
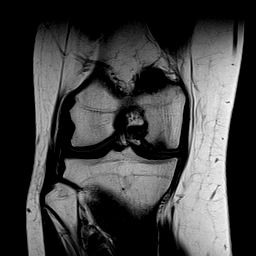
[im 19/26]
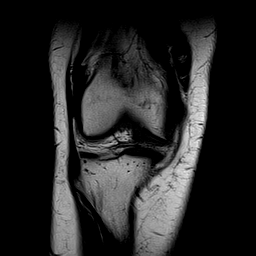
[im 26/26]
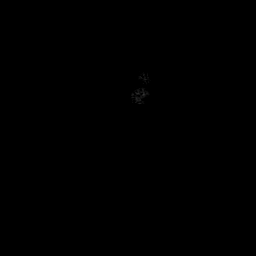

[Series 5: T2 fat-sat · coronal · 4.0mm · 0.62mm/px · 5 of 26 slices shown (2 of 3)]
[im 1/26]
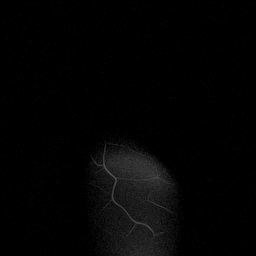
[im 7/26]
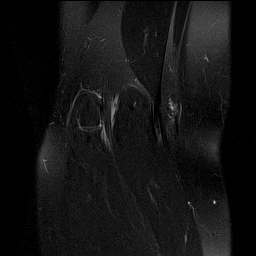
[im 13/26]
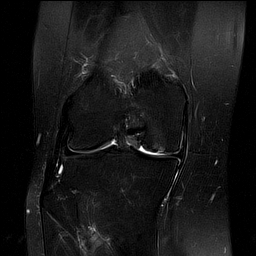
[im 19/26]
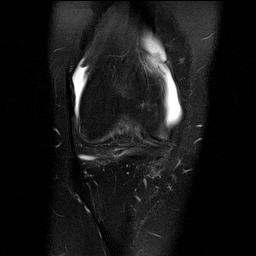
[im 26/26]
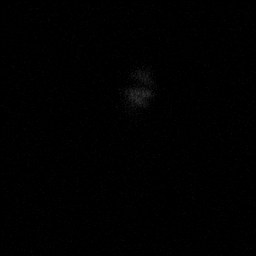

[Series 6: PD fat-sat · coronal · 4.0mm · 0.62mm/px · 5 of 26 slices shown (1 of 3)]
[im 1/26]
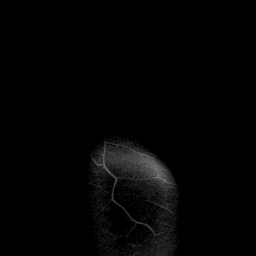
[im 7/26]
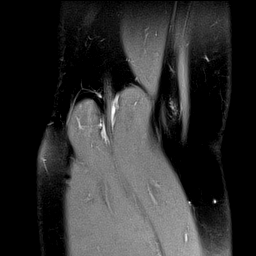
[im 13/26]
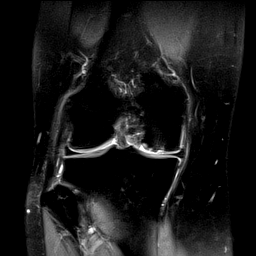
[im 19/26]
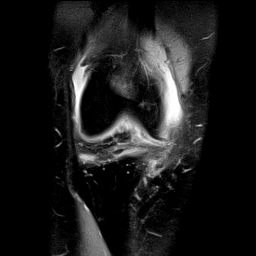
[im 26/26]
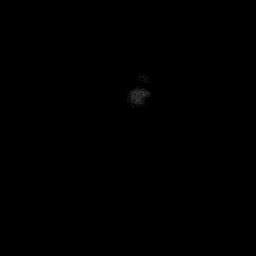

[Series 7: PD fat-sat · sagittal · 3.0mm · 0.62mm/px · 7 of 33 slices shown (2 of 3)]
[im 1/33]
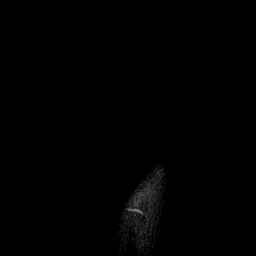
[im 6/33]
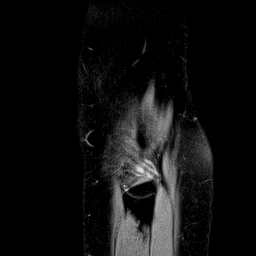
[im 11/33]
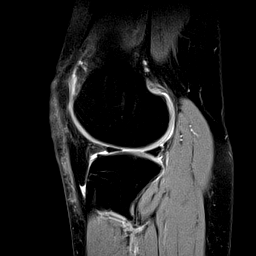
[im 17/33]
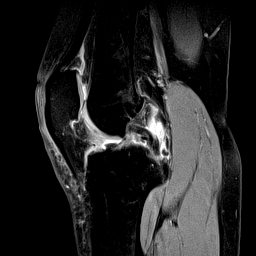
[im 22/33]
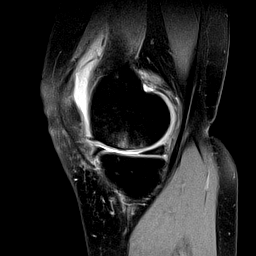
[im 27/33]
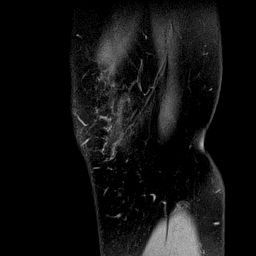
[im 33/33]
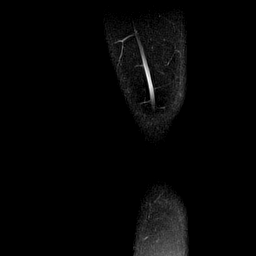

[Series 8: T2 fat-sat · sagittal · 3.0mm · 0.62mm/px · 7 of 33 slices shown (3 of 3)]
[im 1/33]
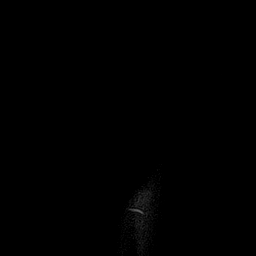
[im 6/33]
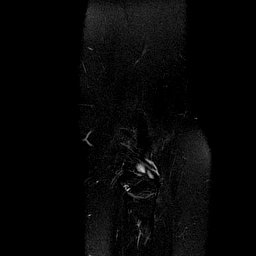
[im 11/33]
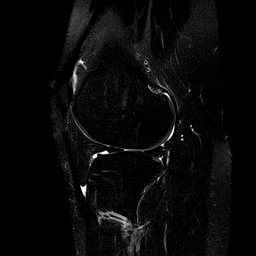
[im 17/33]
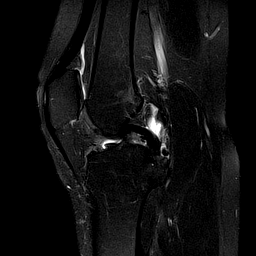
[im 22/33]
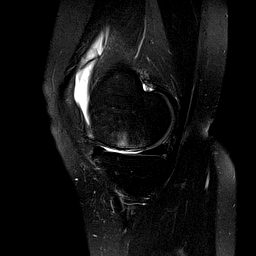
[im 27/33]
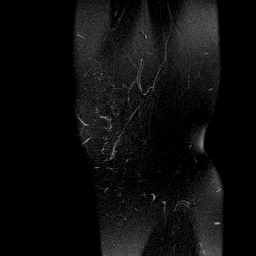
[im 33/33]
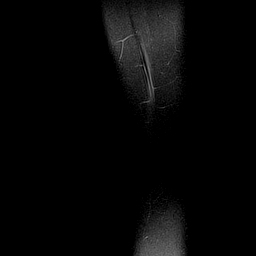

[Series 9: PD fat-sat · coronal · 2.0mm · 0.62mm/px · 4 of 19 slices shown (3 of 3)]
[im 1/19]
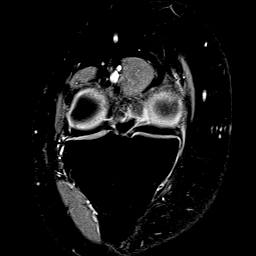
[im 7/19]
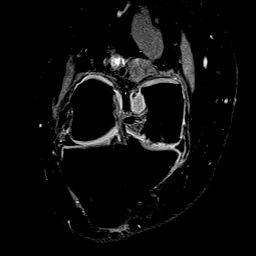
[im 13/19]
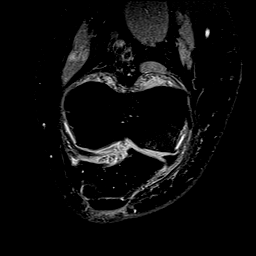
[im 19/19]
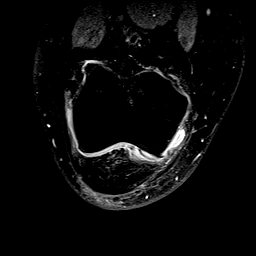

[40 of 40 positions shown; findings below may reference images not displayed]

FINDINGS: MENISCI

Medial meniscus:  Intact.

Lateral meniscus:  Intact.

LIGAMENTS

Cruciates:  Intact.

Collaterals:  Intact.

CARTILAGE

Patellofemoral: Minimally degenerated along the inferior pole of the
lateral patella and lateral femoral trochlea.

Medial: Thinned and irregular with secondary subchondral edema and
some small subchondral cysts in the weight-bearing medial femoral
condyle.

Lateral:  Preserved.

Joint:  Small effusion.

Popliteal Fossa:  No Baker's cyst.

Extensor Mechanism:  Intact.

Bones: Mild osteophytosis is most notable medially. No fracture or
worrisome lesion.

Other: None.
IMPRESSION: Negative for meniscal or ligament tear.

Moderate medial compartment osteoarthritis is advanced for age. Mild
degenerative change is seen in the patellofemoral compartment.

## 2020-03-01 ENCOUNTER — Telehealth: Payer: Self-pay

## 2020-03-01 NOTE — Telephone Encounter (Signed)
I did speak with the patient in length.  The MRI of her right knee does show cartilage thinning but no full-thickness cartilage loss.  This is more of moderate arthritis.  She is a perfect candidate for hyaluronic acid for that knee so I let her know that this could be ordered to treat the pain from osteoarthritis in her right knee.  I told her someone from our office will work on ordering it and then will be in touch once it is approved and comes in.

## 2020-03-01 NOTE — Telephone Encounter (Signed)
Patient called she is requesting a phone call or virtual call to go over MRI Review results with Dr.Blackman. Call [phone]

## 2020-03-02 NOTE — Telephone Encounter (Signed)
Please get auth for Right knee gel injections

## 2020-03-05 NOTE — Telephone Encounter (Signed)
Noted  

## 2020-03-19 DIAGNOSIS — Z135 Encounter for screening for eye and ear disorders: Secondary | ICD-10-CM | POA: Diagnosis not present

## 2020-03-19 DIAGNOSIS — H52223 Regular astigmatism, bilateral: Secondary | ICD-10-CM | POA: Diagnosis not present

## 2020-03-26 ENCOUNTER — Telehealth: Payer: Self-pay

## 2020-03-26 NOTE — Telephone Encounter (Signed)
Submitted VOB, SynviscOne, right knee. 

## 2020-04-02 ENCOUNTER — Telehealth: Payer: Self-pay

## 2020-04-02 NOTE — Telephone Encounter (Signed)
Pt's insurance requires PA-the paperwork was completed and faxed to the insurance company

## 2020-04-11 ENCOUNTER — Encounter (HOSPITAL_BASED_OUTPATIENT_CLINIC_OR_DEPARTMENT_OTHER): Payer: Self-pay | Admitting: Emergency Medicine

## 2020-04-11 ENCOUNTER — Other Ambulatory Visit: Payer: Self-pay

## 2020-04-11 ENCOUNTER — Emergency Department (HOSPITAL_BASED_OUTPATIENT_CLINIC_OR_DEPARTMENT_OTHER)
Admission: EM | Admit: 2020-04-11 | Discharge: 2020-04-11 | Disposition: A | Discharge status: Home / Self Care | Payer: BC Managed Care – PPO | Attending: Emergency Medicine | Admitting: Emergency Medicine

## 2020-04-11 DIAGNOSIS — Z79899 Other long term (current) drug therapy: Secondary | ICD-10-CM | POA: Diagnosis not present

## 2020-04-11 DIAGNOSIS — E039 Hypothyroidism, unspecified: Secondary | ICD-10-CM | POA: Diagnosis not present

## 2020-04-11 DIAGNOSIS — K645 Perianal venous thrombosis: Secondary | ICD-10-CM | POA: Diagnosis not present

## 2020-04-11 DIAGNOSIS — K649 Unspecified hemorrhoids: Secondary | ICD-10-CM

## 2020-04-11 DIAGNOSIS — K625 Hemorrhage of anus and rectum: Secondary | ICD-10-CM | POA: Diagnosis not present

## 2020-04-11 DIAGNOSIS — J45909 Unspecified asthma, uncomplicated: Secondary | ICD-10-CM | POA: Diagnosis not present

## 2020-04-11 HISTORY — DX: Unspecified asthma, uncomplicated: J45.909

## 2020-04-11 MED ORDER — OXYCODONE-ACETAMINOPHEN 5-325 MG PO TABS: 1.0000 | ORAL_TABLET | ORAL | 0 refills | Status: DC | PRN

## 2020-04-11 MED ORDER — LIDOCAINE HCL 2 % EX GEL: 1.0000 "application " | CUTANEOUS | 0 refills | Status: DC | PRN

## 2020-04-11 MED ORDER — OXYCODONE-ACETAMINOPHEN 5-325 MG PO TABS
1.0000 | ORAL_TABLET | Freq: Once | ORAL | Status: DC
  Filled 2020-04-11: qty 1

## 2020-04-11 MED ORDER — LIDOCAINE HCL URETHRAL/MUCOSAL 2 % EX GEL
1.0000 "application " | Freq: Once | CUTANEOUS | Status: AC
  Administered 2020-04-11: 1 via TOPICAL
  Filled 2020-04-11: qty 11

## 2020-04-11 NOTE — ED Provider Notes (Signed)
[facility] DEPARTMENT Provider Note   CSN: [id number] Arrival date & time: 04/11/20  1121     History Chief Complaint  Patient presents with  . Rectal Bleeding    Wendy Clements is a 34 y.o. female.  34yo F who p/w rectal pain and bleeding. Pt reports she had a severe tear through her rectum after vaginal delivery of her daughter almost 4 years ago and has had problems since that time. She has intermittently dealt with hemorrhoids and has been very diligent about having daily bowel movements, not straining during bowel movements.  She has occasionally had streaks of blood in her stool which she attributed to the hemorrhoids but otherwise reports that she has had normal soft bowel movements lately.  This morning, she went to the bathroom to have a bowel movement and when she looked in the toilet, she noted a large amount of blood.  She then developed a large swollen mass near her anus that is very painful and she has been laying on her side because of it. She denies straining hard this morning. No vomiting or recent illness.   The history is provided by the patient.  Rectal Bleeding      Past Medical History:  Diagnosis Date  . Asthma   . Hx of varicella   . Hypothyroidism   . OCD (obsessive compulsive disorder)   . Vaginal Pap smear, abnormal     Patient Active Problem List   Diagnosis Date Noted  . De Quervain's disease (tenosynovitis) 01/08/2017  . Indication for care in labor or delivery 05/26/2016  . SVD (spontaneous vaginal delivery) 05/26/2016  . Postpartum care following vaginal delivery (2/2) 05/26/2016    Past Surgical History:  Procedure Laterality Date  . LEEP       OB History    Gravida  3   Para  1   Term  1   Preterm      AB  2   Living  1     SAB  1   IAB  1   Ectopic      Multiple  0   Live Births  1           Family History  Problem Relation Age of Onset  . Cancer Maternal Grandmother      Social History   Tobacco Use  . Smoking status: Never Smoker  . Smokeless tobacco: Never Used  Vaping Use  . Vaping Use: Never used  Substance Use Topics  . Alcohol use: Yes  . Drug use: No    Home Medications Prior to Admission medications   Medication Sig Start Date End Date Taking? Authorizing Provider  acetaminophen (TYLENOL) 500 MG tablet Take 1,000 mg by mouth every 6 (six) hours as needed for mild pain.    [provider]  ADVAIR DISKUS 250-50 MCG/DOSE AEPB Inhale 1 puff into the lungs daily. 03/20/16   [provider]  albuterol (PROVENTIL HFA;VENTOLIN HFA) 108 (90 Base) MCG/ACT inhaler Inhale 1-2 puffs into the lungs every 6 (six) hours as needed for wheezing or shortness of breath.    [provider]  Butalbital-APAP-Caffeine 50-300-40 MG CAPS Take 1 tablet by mouth daily as needed. 05/22/16   [provider]  calcium carbonate (TUMS - DOSED IN MG ELEMENTAL CALCIUM) 500 MG chewable tablet Chew 1-2 tablets by mouth 3 (three) times daily as needed for indigestion or heartburn.    [provider]  ibuprofen (ADVIL,MOTRIN) 600 MG tablet  Take 1 tablet (600 mg total) by mouth every 6 (six) hours. 05/28/16   Laury Deep, CNM  levothyroxine (SYNTHROID, LEVOTHROID) 50 MCG tablet Take 50 mcg by mouth daily. 04/25/16   [provider]  lidocaine (XYLOCAINE) 2 % jelly Apply 1 application topically as needed. Apply small amount to hemorrhoid as needed for pain 04/11/20   Emanuelle Hammerstrom, Wenda Overland, MD  montelukast (SINGULAIR) 10 MG tablet Take 10 mg by mouth at bedtime.    [provider]  oxyCODONE-acetaminophen (PERCOCET) 5-325 MG tablet Take 1 tablet by mouth every 4 (four) hours as needed for severe pain. 04/11/20   Emya Picado, Wenda Overland, MD  Prenatal Vit-Fe Fumarate-FA (PRENATAL MULTIVITAMIN) TABS tablet Take 1 tablet by mouth daily at 12 noon.    [provider]  ranitidine (ZANTAC) 150 MG tablet Take 150 mg by mouth  2 (two) times daily.    [provider]    Allergies    Patient has no known allergies.  Review of Systems   Review of Systems  Gastrointestinal: Positive for hematochezia.   All other systems reviewed and are negative except that which was mentioned in HPI  Physical Exam Updated Vital Signs BP 108/68   Pulse 79   Temp 98.1 F (36.7 C) (Oral)   Resp 18   Ht 5\' 2"  (1.575 m)   Wt 63.5 kg   LMP 04/04/2020   SpO2 99%   Breastfeeding No   BMI 25.61 kg/m   Physical Exam Vitals and nursing note reviewed. Exam conducted with a chaperone present.  Constitutional:      General: She is not in acute distress.    Appearance: She is well-developed and well-nourished.     Comments: Laying prone, tearful  HENT:     Head: Normocephalic and atraumatic.  Eyes:     Conjunctiva/sclera: Conjunctivae normal.  Genitourinary:    Exam position: Prone.       Comments: Large thrombosed mass at 6 o'clock position adjacent to anus, normal rectal tone Musculoskeletal:     Cervical back: Neck supple.  Skin:    General: Skin is warm and dry.  Neurological:     Mental Status: She is alert and oriented to person, place, and time.  Psychiatric:        Mood and Affect: Mood and affect normal.        Judgment: Judgment normal.     ED Results / Procedures / Treatments   Labs (all labs ordered are listed, but only abnormal results are displayed) Labs Reviewed - No data to display  EKG None  Radiology No results found.  Procedures Procedures (including critical care time)  Medications Ordered in ED Medications  oxyCODONE-acetaminophen (PERCOCET/ROXICET) 5-325 MG per tablet 1 tablet (1 tablet Oral Not Given 04/11/20 1306)  lidocaine (XYLOCAINE) 2 % jelly 1 application (1 application Topical Given 04/11/20 1304)    ED Course  I have reviewed the triage vital signs and the nursing notes.      MDM Rules/Calculators/A&P                          On exam, pt appears to  have thrombosed, ruptured hemorrhoid rather than rectal prolaspe. Spoke with gen surgery, Dr. Brantley Stage, who recommended Sitz baths, topical lidocaine jelly, pain meds with stool softeners, and f/u in clinic tomorrow for further eval. She may ultimately need referral to colorectal surgery given her history but can be seen tomorrow in clinic in interim. Discussed  this plan w/ patient and her family. Reviewed return precautions including severe bleeding or severe pain.  Final Clinical Impression(s) / ED Diagnoses Final diagnoses:  Bleeding hemorrhoid    Rx / DC Orders ED Discharge Orders         Ordered    oxyCODONE-acetaminophen (PERCOCET) 5-325 MG tablet  Every 4 hours PRN,   Status:  Discontinued        04/11/20 1343    lidocaine (XYLOCAINE) 2 % jelly  As needed        04/11/20 1343    oxyCODONE-acetaminophen (PERCOCET) 5-325 MG tablet  Every 4 hours PRN,   Status:  Discontinued        04/11/20 1343    oxyCODONE-acetaminophen (PERCOCET) 5-325 MG tablet  Every 4 hours PRN        04/11/20 1345           Wendy Clements, Wenda Overland, MD 04/11/20 1349

## 2020-04-11 NOTE — ED Notes (Signed)
Surgery consult called via [place], spoke with Ruby

## 2020-04-11 NOTE — ED Triage Notes (Signed)
Pt c/o rectal bleeding onset set this morning. Pt using menstrual pad due to active bleeding.

## 2020-04-11 NOTE — ED Notes (Signed)
AVS reviewed with pt and husband, provided peri-pads and mesh undergarments as well, Instructions on changing to soft diet, using Sitz Baths and also safety while taking PO opioids. Opportunity for questions provided. Copy of AVS provided as well.

## 2020-04-11 NOTE — ED Notes (Signed)
She tells me she has been having regular, very "soft" bm's for "a long time now".she is here today with c/o of partially pushing out a very large, hard stool, "but I pushed it out too far to push back in and I can't push it out--I saw so much blood too". She is lying prone, which is her position of comfort. Her significant other is with her.

## 2020-04-12 ENCOUNTER — Telehealth: Payer: Self-pay

## 2020-04-12 NOTE — Telephone Encounter (Signed)
Approved, SynviscOne, right knee. Buy & Bill Covered at 100% after Co-pay Co-pay of $80.00 required PA required PA Approval# [id number] Valid 04/02/2020- 09/29/2020  Appt. 04/19/2020 with Dr. Ninfa Linden

## 2020-04-19 ENCOUNTER — Ambulatory Visit: Payer: BC Managed Care – PPO | Admitting: Orthopaedic Surgery

## 2020-04-19 ENCOUNTER — Encounter: Payer: Self-pay | Admitting: Orthopaedic Surgery

## 2020-04-19 DIAGNOSIS — G8929 Other chronic pain: Secondary | ICD-10-CM | POA: Diagnosis not present

## 2020-04-19 DIAGNOSIS — M25561 Pain in right knee: Secondary | ICD-10-CM

## 2020-04-19 DIAGNOSIS — M1711 Unilateral primary osteoarthritis, right knee: Secondary | ICD-10-CM

## 2020-04-19 MED ORDER — HYLAN G-F 20 48 MG/6ML IX SOSY
48.0000 mg | PREFILLED_SYRINGE | INTRA_ARTICULAR | Status: AC | PRN
  Administered 2020-04-19: 48 mg via INTRA_ARTICULAR

## 2020-04-19 NOTE — Progress Notes (Signed)
   Procedure Note  Patient: TULSI CROSSETT             Date of Birth: 31-Mar-1986           MRN: [id number]             Visit Date: 04/19/2020  Procedures: Visit Diagnoses:  1. Chronic pain of right knee   2. Unilateral primary osteoarthritis, right knee     Large Joint Inj: R knee on 04/19/2020 2:04 PM Indications: pain and diagnostic evaluation Details: 22 G 1.5 in needle, superolateral approach  Arthrogram: No  Medications: 48 mg Hylan 48 MG/6ML Outcome: tolerated well, no immediate complications Procedure, treatment alternatives, risks and benefits explained, specific risks discussed. Consent was given by the patient. Immediately prior to procedure a time out was called to verify the correct patient, procedure, equipment, support staff and site/side marked as required. Patient was prepped and draped in the usual sterile fashion.    The patient is a petite 34 year old female is here today for Synvisc 1 injection in her right knee to treat the pain from approximately moderate medial compartment arthritis in her right knee.  She has had an MRI that knee that showed thinning of the cartilage on the medial aspect of the knee with no meniscal tear.  Her ligaments are all intact.  There is also some subchondral edema.  She still has problems going up and down stairs and pain in that knee and signs and symptoms like the knee will give way.  There is no effusion on the knee today.  I did place Synvisc 1 in her knee without difficulty.  She is someone who should consider outpatient physical therapy at some point to strengthen the muscles around her knee.  She certainly could be a candidate at some point for a partial knee arthroplasty but not a total knee replacement.  All questions and concerns were answered and addressed.  Follow-up for now is as needed.

## 2020-05-03 ENCOUNTER — Ambulatory Visit: Payer: BC Managed Care – PPO | Admitting: Gastroenterology

## 2020-05-03 ENCOUNTER — Encounter: Payer: Self-pay | Admitting: Gastroenterology

## 2020-05-03 VITALS — BP 118/80 | HR 76 | Ht 62.0 in | Wt 146.2 lb

## 2020-05-03 DIAGNOSIS — R194 Change in bowel habit: Secondary | ICD-10-CM | POA: Insufficient documentation

## 2020-05-03 DIAGNOSIS — K625 Hemorrhage of anus and rectum: Secondary | ICD-10-CM | POA: Insufficient documentation

## 2020-05-03 MED ORDER — PLENVU 140 G PO SOLR: 1.0000 | ORAL | 0 refills | Status: DC

## 2020-05-03 NOTE — Progress Notes (Signed)
05/03/2020 Wendy Clements [id number] 02-11-1986   HISTORY OF PRESENT ILLNESS: This is a pleasant 35 year old female.  She is new to our office.  She was referred here by Carlena Hurl, PA-C, from [facility] to discuss a colonoscopy.  The patient tells me that over the past few months she has had some small amounts of blood in her stool with some mucus.  She says over the past 8 weeks she has had some change in her bowel habits with thin pencillike stools and then some loose stools as well.  She says that on December 19 she developed a huge protrusion from her rectum with a profuse amount of rectal bleeding.  She says that it was the size of a small pear.  She went to the emergency department with a towel between her legs because the bleeding would not stop.  They diagnosed her with a large hemorrhoid and told her to follow-up with [facility].  By the time that she saw them it had burst/ruptured.  She says that since then she has continued to have bleeding.  She was referred here for the bleeding and to discuss colonoscopy.   Past Medical History:  Diagnosis Date  . Asthma   . Hx of varicella   . Hypothyroidism   . OCD (obsessive compulsive disorder)   . Vaginal Pap smear, abnormal    Past Surgical History:  Procedure Laterality Date  . LEEP      reports that she has never smoked. She has never used smokeless tobacco. She reports current alcohol use. She reports that she does not use drugs. family history includes Cancer in her maternal grandmother. No Known Allergies    Outpatient Encounter Medications as of 05/03/2020  Medication Sig  . albuterol (PROVENTIL HFA;VENTOLIN HFA) 108 (90 Base) MCG/ACT inhaler Inhale 1-2 puffs into the lungs every 6 (six) hours as needed for wheezing or shortness of breath.  Marland Kitchen ibuprofen (ADVIL,MOTRIN) 600 MG tablet Take 1 tablet (600 mg total) by mouth every 6 (six) hours.  . montelukast (SINGULAIR) 10 MG tablet Take 10 mg by  mouth at bedtime.  . [DISCONTINUED] acetaminophen (TYLENOL) 500 MG tablet Take 1,000 mg by mouth every 6 (six) hours as needed for mild pain.  . [DISCONTINUED] ADVAIR DISKUS 250-50 MCG/DOSE AEPB Inhale 1 puff into the lungs daily.  . [DISCONTINUED] Butalbital-APAP-Caffeine 50-300-40 MG CAPS Take 1 tablet by mouth daily as needed.  . [DISCONTINUED] calcium carbonate (TUMS - DOSED IN MG ELEMENTAL CALCIUM) 500 MG chewable tablet Chew 1-2 tablets by mouth 3 (three) times daily as needed for indigestion or heartburn.  . [DISCONTINUED] levothyroxine (SYNTHROID, LEVOTHROID) 50 MCG tablet Take 50 mcg by mouth daily.  . [DISCONTINUED] lidocaine (XYLOCAINE) 2 % jelly Apply 1 application topically as needed. Apply small amount to hemorrhoid as needed for pain  . [DISCONTINUED] oxyCODONE-acetaminophen (PERCOCET) 5-325 MG tablet Take 1 tablet by mouth every 4 (four) hours as needed for severe pain.  . [DISCONTINUED] Prenatal Vit-Fe Fumarate-FA (PRENATAL MULTIVITAMIN) TABS tablet Take 1 tablet by mouth daily at 12 noon.  . [DISCONTINUED] ranitidine (ZANTAC) 150 MG tablet Take 150 mg by mouth 2 (two) times daily.   No facility-administered encounter medications on file as of 05/03/2020.     REVIEW OF SYSTEMS  : All other systems reviewed and negative except where noted in the History of Present Illness.   PHYSICAL EXAM: BP 118/80   Pulse 76   Ht 5\' 2"  (1.575 m)  Wt 146 lb 3.2 oz (66.3 kg)   LMP 04/04/2020   BMI 26.74 kg/m  General: Well developed white female in no acute distress Head: Normocephalic and atraumatic Eyes:  Sclerae anicteric, conjunctiva pink. Ears: Normal auditory acuity Lungs: Clear throughout to auscultation; no W/R/R. Heart: Regular rate and rhythm; no M/R/G. Abdomen: Soft, non-distended.  BS present.  Non-tender. Rectal:  Will be done at the time of colonoscopy. Musculoskeletal: Symmetrical with no gross deformities  Skin: No lesions on visible extremities Extremities: No  edema  Neurological: Alert oriented x 4, grossly non-focal Psychological:  Alert and cooperative. Normal mood and affect  ASSESSMENT AND PLAN: *35 year old female with complaints of change in bowel habits and rectal bleeding.  She says that her bowel habits have changed over the past 8 weeks becoming very thin and with some loose stools.  Recently had a large episode of rectal bleeding that took her to the ED.  She describes a large protrusion from her rectum, the size of small pear.  Says that it burst and she has continued to have some bleeding since then.  We will plan for colonoscopy next week.  The risks, benefits, and alternatives to colonoscopy were discussed with the patient and she consents to proceed.    CC:  Ryter-Brown, Shyrl Numbers, *  CC:  Carlena Hurl

## 2020-05-03 NOTE — Patient Instructions (Signed)
If you are age 36 or older, your body mass index should be between 23-30. Your Body mass index is 26.74 kg/m. If this is out of the aforementioned range listed, please consider follow up with your Primary Care Provider.  If you are age 93 or younger, your body mass index should be between 19-25. Your Body mass index is 26.74 kg/m. If this is out of the aformentioned range listed, please consider follow up with your Primary Care Provider.   You have been scheduled for a colonoscopy. Please follow written instructions given to you at your visit today.  Please pick up your prep supplies at the pharmacy within the next 1-3 days. If you use inhalers (even only as needed), please bring them with you on the day of your procedure.  Follow up pending the results of your Colonoscopy or as needed.  Thank you for entrusting me with your care and choosing [facility].  Alonza Bogus, PA-C

## 2020-05-03 NOTE — Progress Notes (Signed)
Reviewed.  Wendy Clements L. Breckan Cafiero, MD, MPH  

## 2020-05-05 NOTE — Progress Notes (Signed)
Reviewed.  Wendy Kneale L. Neri Vieyra, MD, MPH  

## 2020-05-06 ENCOUNTER — Other Ambulatory Visit: Payer: Self-pay | Admitting: Gastroenterology

## 2020-05-07 LAB — SARS CORONAVIRUS 2 (TAT 6-24 HRS): SARS Coronavirus 2: NEGATIVE

## 2020-05-10 ENCOUNTER — Ambulatory Visit (HOSPITAL_COMMUNITY): Payer: 59 | Admitting: Anesthesiology

## 2020-05-10 ENCOUNTER — Ambulatory Visit (HOSPITAL_COMMUNITY)
Admission: RE | Admit: 2020-05-10 | Discharge: 2020-05-10 | Disposition: A | Discharge status: Home / Self Care | Payer: 59 | Source: Ambulatory Visit | Attending: Internal Medicine | Admitting: Internal Medicine

## 2020-05-10 ENCOUNTER — Encounter (HOSPITAL_COMMUNITY)
Admission: RE | Disposition: A | Discharge status: Home / Self Care | Payer: Self-pay | Source: Ambulatory Visit | Attending: Internal Medicine

## 2020-05-10 ENCOUNTER — Other Ambulatory Visit: Payer: Self-pay

## 2020-05-10 ENCOUNTER — Encounter (HOSPITAL_COMMUNITY): Payer: Self-pay | Admitting: Internal Medicine

## 2020-05-10 ENCOUNTER — Encounter: Payer: 59 | Admitting: Gastroenterology

## 2020-05-10 DIAGNOSIS — Z7951 Long term (current) use of inhaled steroids: Secondary | ICD-10-CM | POA: Insufficient documentation

## 2020-05-10 DIAGNOSIS — K5730 Diverticulosis of large intestine without perforation or abscess without bleeding: Secondary | ICD-10-CM | POA: Insufficient documentation

## 2020-05-10 DIAGNOSIS — Z809 Family history of malignant neoplasm, unspecified: Secondary | ICD-10-CM | POA: Insufficient documentation

## 2020-05-10 DIAGNOSIS — Z791 Long term (current) use of non-steroidal anti-inflammatories (NSAID): Secondary | ICD-10-CM | POA: Diagnosis not present

## 2020-05-10 DIAGNOSIS — D122 Benign neoplasm of ascending colon: Secondary | ICD-10-CM | POA: Insufficient documentation

## 2020-05-10 DIAGNOSIS — J45909 Unspecified asthma, uncomplicated: Secondary | ICD-10-CM | POA: Diagnosis not present

## 2020-05-10 DIAGNOSIS — K625 Hemorrhage of anus and rectum: Secondary | ICD-10-CM | POA: Diagnosis present

## 2020-05-10 DIAGNOSIS — D125 Benign neoplasm of sigmoid colon: Secondary | ICD-10-CM | POA: Diagnosis not present

## 2020-05-10 DIAGNOSIS — Z79899 Other long term (current) drug therapy: Secondary | ICD-10-CM | POA: Diagnosis not present

## 2020-05-10 HISTORY — PX: POLYPECTOMY: SHX5525

## 2020-05-10 HISTORY — PX: HEMOSTASIS CLIP PLACEMENT: SHX6857

## 2020-05-10 HISTORY — PX: SCLEROTHERAPY: SHX6841

## 2020-05-10 HISTORY — PX: COLONOSCOPY WITH PROPOFOL: SHX5780

## 2020-05-10 LAB — PREGNANCY, URINE: Preg Test, Ur: NEGATIVE

## 2020-05-10 SURGERY — COLONOSCOPY WITH PROPOFOL
Anesthesia: Monitor Anesthesia Care

## 2020-05-10 MED ORDER — LACTATED RINGERS IV SOLN: INTRAVENOUS | Status: DC

## 2020-05-10 MED ORDER — SODIUM CHLORIDE 0.9 % IV SOLN: INTRAVENOUS | Status: DC

## 2020-05-10 MED ORDER — LIDOCAINE 2% (20 MG/ML) 5 ML SYRINGE
INTRAMUSCULAR | Status: DC | PRN
  Administered 2020-05-10: 40 mg via INTRAVENOUS

## 2020-05-10 MED ORDER — SPOT INK MARKER SYRINGE KIT
PACK | SUBMUCOSAL | Status: AC
  Filled 2020-05-10: qty 5

## 2020-05-10 MED ORDER — PROPOFOL 500 MG/50ML IV EMUL
INTRAVENOUS | Status: DC | PRN
  Administered 2020-05-10: 300 ug/kg/min via INTRAVENOUS

## 2020-05-10 MED ORDER — PHENYLEPHRINE 40 MCG/ML (10ML) SYRINGE FOR IV PUSH (FOR BLOOD PRESSURE SUPPORT)
PREFILLED_SYRINGE | INTRAVENOUS | Status: DC | PRN
  Administered 2020-05-10: 80 ug via INTRAVENOUS
  Administered 2020-05-10 (×2): 120 ug via INTRAVENOUS
  Administered 2020-05-10: 80 ug via INTRAVENOUS

## 2020-05-10 SURGICAL SUPPLY — 21 items

## 2020-05-10 NOTE — Transfer of Care (Signed)
Immediate Anesthesia Transfer of Care Note  Patient: Wendy Clements  Procedure(s) Performed: COLONOSCOPY WITH PROPOFOL (N/A ) POLYPECTOMY SCLEROTHERAPY HEMOSTASIS CLIP PLACEMENT  Patient Location: Endoscopy Unit  Anesthesia Type:MAC  Level of Consciousness: awake, alert , oriented and patient cooperative  Airway & Oxygen Therapy: Patient Spontanous Breathing and Patient connected to face mask oxygen  Post-op Assessment: Report given to RN, Post -op Vital signs reviewed and stable and Patient moving all extremities  Post vital signs: Reviewed and stable  Last Vitals:  Vitals Value Taken Time  BP    Temp    Pulse    Resp    SpO2      Last Pain:  Vitals:   05/10/20 1229  TempSrc: Oral  PainSc: 0-No pain         Complications: No complications documented.

## 2020-05-10 NOTE — H&P (Signed)
  [facility] History and Physical   Primary Care Physician:  Wayland Salinas, MD   Reason for Procedure:   rectal bleeding, thin stools  Plan:    Colonoscopy, possible hemorrhoid banding     HPI: Wendy Clements is a 35 y.o. female with hx of recent rectal bleeding, prolapsed hemorrhoid ? Thrombosed and thin stools. Still w/ intermittent rectal bleeding since resolution of prolapse. Presented to [facility] today for scheduled colonoscopy but we were closed and she did not receive message.  Procedure switched to hospital.   Past Medical History:  Diagnosis Date  . Asthma   . Hx of varicella   . Hypothyroidism   . OCD (obsessive compulsive disorder)   . Vaginal Pap smear, abnormal     Past Surgical History:  Procedure Laterality Date  . LEEP      Prior to Admission medications   Medication Sig Start Date End Date Taking? Authorizing Provider  albuterol (PROVENTIL HFA;VENTOLIN HFA) 108 (90 Base) MCG/ACT inhaler Inhale 1-2 puffs into the lungs every 6 (six) hours as needed for wheezing or shortness of breath.   Yes [provider]  ibuprofen (ADVIL,MOTRIN) 600 MG tablet Take 1 tablet (600 mg total) by mouth every 6 (six) hours. 05/28/16  Yes Renato Battles, Rol[place], CNM  montelukast (SINGULAIR) 10 MG tablet Take 10 mg by mouth at bedtime.   Yes [provider]  PEG-KCl-NaCl-NaSulf-Na Asc-C (PLENVU) 140 g SOLR Take 1 kit by mouth as directed. Manufacturer's coupon Universal coupon code:BIN: P2366821; GROUP: [id number]; PCN: [id number]; ID: [id number]; PAY NO MORE $50; NO prior authorization 05/03/20   Zehr, Laban Emperor, PA-C    Current Facility-Administered Medications  Medication Dose Route Frequency Provider Last Rate Last Admin  . lactated ringers infusion   Intravenous Continuous Gatha Mayer, MD        Allergies as of 05/10/2020  . (No Known Allergies)    Family History  Problem Relation Age of Onset  . Cancer Maternal Grandmother     Social  History   Social History Narrative   Married, one daughter   Owner hair salon     Review of Systems:  All other review of systems negative except as mentioned in the HPI.  Physical Exam: Vital signs in last 24 hours: Temp:  [98.6 F (37 C)] 98.6 F (37 C) (01/17 1229) Pulse Rate:  [78] 78 (01/17 1229) Resp:  [14] 14 (01/17 1229) BP: (106)/(73) 106/73 (01/17 1229) SpO2:  [98 %] 98 % (01/17 1229) Weight:  [64.4 kg] 64.4 kg (01/17 1229)   General:   Alert,  Well-developed, well-nourished, pleasant and cooperative in NAD Lungs:  Clear throughout to auscultation.   Heart:  Regular rate and rhythm; no murmurs, clicks, rubs,  or gallops. Abdomen:  Soft, nontender and nondistended. Normal bowel sounds.   Neuro/Psych:  Alert and cooperative. Normal mood and affect. A and O x 3   _0  E. Carlean Purl, MD, [facility] [phone] (pager) 05/10/2020 12:44 PM@

## 2020-05-10 NOTE — Anesthesia Postprocedure Evaluation (Signed)
Anesthesia Post Note  Patient: Wendy Clements  Procedure(s) Performed: COLONOSCOPY WITH PROPOFOL (N/A ) POLYPECTOMY SCLEROTHERAPY HEMOSTASIS CLIP PLACEMENT     Patient location during evaluation: Endoscopy Anesthesia Type: MAC Level of consciousness: awake and alert Pain management: pain level controlled Vital Signs Assessment: post-procedure vital signs reviewed and stable Respiratory status: spontaneous breathing, nonlabored ventilation and respiratory function stable Cardiovascular status: blood pressure returned to baseline and stable Postop Assessment: no apparent nausea or vomiting Anesthetic complications: no   No complications documented.  Last Vitals:  Vitals:   05/10/20 1420 05/10/20 1430  BP: 91/63 97/72  Pulse:  75  Resp: (!) 22 15  Temp:    SpO2: 100% 100%    Last Pain:  Vitals:   05/10/20 1430  TempSrc:   PainSc: 0-No pain                 Lidia Collum

## 2020-05-10 NOTE — Op Note (Addendum)
[facility] Patient Name: Wendy Clements Procedure Date: 05/10/2020 MRN: [id number] Attending MD: Gatha Mayer , MD Date of Birth: 1985/10/02 CSN: [id number] Age: 35 Admit Type: Outpatient Procedure:                Colonoscopy Indications:              Rectal bleeding, Change in stool caliber Providers:                Gatha Mayer, MD, Josie Dixon, RN, Carmie End, RN, Barrett Henle, CRNA Referring MD:             Alonza Bogus PA, PA, Shyrl Numbers. Ryter-brown Medicines:                Propofol per Anesthesia, Monitored Anesthesia Care Complications:            No immediate complications. Estimated Blood Loss:     Estimated blood loss was minimal. Procedure:                Pre-Anesthesia Assessment:                           - Prior to the procedure, a History and Physical                            was performed, and patient medications and                            allergies were reviewed. The patient's tolerance of                            previous anesthesia was also reviewed. The risks                            and benefits of the procedure and the sedation                            options and risks were discussed with the patient.                            All questions were answered, and informed consent                            was obtained. Prior Anticoagulants: The patient has                            taken no previous anticoagulant or antiplatelet                            agents. ASA Grade Assessment: II - A patient with                            mild systemic disease. After reviewing the risks  and benefits, the patient was deemed in                            satisfactory condition to undergo the procedure.                           After obtaining informed consent, the colonoscope                            was passed under direct vision. Throughout the                             procedure, the patient's blood pressure, pulse, and                            oxygen saturations were monitored continuously. The                            CF-HQ190L ([id number]) Olympus colonoscope was                            introduced through the anus and advanced to the the                            cecum, identified by appendiceal orifice and                            ileocecal valve. The colonoscopy was somewhat                            difficult due to location of polyp. The patient                            tolerated the procedure well. The quality of the                            bowel preparation was excellent. The ileocecal                            valve, appendiceal orifice, and rectum were                            photographed. The bowel preparation used was Plenvu                            via split dose instruction. Scope In: 12:57:52 PM Scope Out: 1:58:01 PM Scope Withdrawal Time: 0 hours 56 minutes 2 seconds  Total Procedure Duration: 1 hour 0 minutes 9 seconds  Findings:      A 20 to 30 mm polyp was found in the sigmoid colon. The polyp was       pedunculated. Polypectomy was attempted, initially using a hot snare.       Polyp resection was incomplete with this device. This intervention then       required a different device and  polypectomy technique. The polyp was       removed with a piecemeal technique using a hot snare. Resection and       retrieval were complete. Estimated blood loss was minimal. To prevent       bleeding after the polypectomy, three hemostatic clips were successfully       placed (MR conditional). There was no bleeding during, or at the end, of       the procedure. Area was tattooed with an injection of 3 mL of Spot       (carbon black). Estimated blood loss: none.      A diminutive polyp was found in the ascending colon. The polyp was       sessile. The polyp was removed with a cold snare. Resection and       retrieval were  complete. Verification of patient identification for the       specimen was done. Estimated blood loss was minimal.      Multiple small and large-mouthed diverticula were found in the sigmoid       colon, descending colon, transverse colon and ascending colon.      The exam was otherwise without abnormality on direct and retroflexion       views.      The perianal and digital rectal examinations were normal. Impression:               - One 20 to 30 mm polyp in the sigmoid colon,                            removed piecemeal using a hot snare. Resected and                            retrieved. Clips (MR conditional) were placed.                            Tattooed. Attempted to use loop device but                            malfunctions so used clips to reduce bleeding                            instead. Switched to gastroscope for better                            positioning also. Polyp was about 30 cm from anus.                            It is possible this was what protruded from rectum                            and led to ED visit as opposed to hemorrhoids.                           - One diminutive polyp in the ascending colon,                            removed with a cold snare.  Resected and retrieved.                           - Diverticulosis in the sigmoid colon, in the                            descending colon, in the transverse colon and in                            the ascending colon.                           - The examination was otherwise normal on direct                            and retroflexion views. Moderate Sedation:      Not Applicable - Patient had care per Anesthesia. Recommendation:           - Patient has a contact number available for                            emergencies. The signs and symptoms of potential                            delayed complications were discussed with the                            patient. Return to normal activities tomorrow.                             Written discharge instructions were provided to the                            patient.                           - Resume previous diet.                           - Continue present medications.                           - No aspirin, ibuprofen, naproxen, or other                            non-steroidal anti-inflammatory drugs for 2 weeks                            after polyp removal.                           - Repeat colonoscopy is recommended. The                            colonoscopy date will be determined after pathology  results from today's exam become available for                            review. Procedure Code(s):        --- Professional ---                           [phone], Colonoscopy, flexible; with removal of                            tumor(s), polyp(s), or other lesion(s) by snare                            technique                           45381, Colonoscopy, flexible; with directed                            submucosal injection(s), any substance Diagnosis Code(s):        --- Professional ---                           K63.5, Polyp of colon                           K62.5, Hemorrhage of anus and rectum                           R19.5, Other fecal abnormalities                           K57.30, Diverticulosis of large intestine without                            perforation or abscess without bleeding CPT copyright 2019 American [employer] Association. All rights reserved. The codes documented in this report are preliminary and upon coder review may  be revised to meet current compliance requirements. Gatha Mayer, MD 05/10/2020 2:19:08 PM This report has been signed electronically. Number of Addenda: 0

## 2020-05-10 NOTE — Discharge Instructions (Addendum)
I found and removed 2 polyps one was tiny and one was quite large.  The large man was at the end of the colon just above the rectum and I think is or was responsible for the bleeding you have been having.  While you and all of [place] have hemorrhoids the hemorrhoids I saw did not seem to be large enough to do banding.  I do think it is possible that this is what protruded from your rectum when he went to the emergency room.  So it might not have been a hemorrhoid.  I am glad they sent you for a colonoscopy.  I will get the polyp pathology results and let you know what these were and what this means as far as repeat testing.  If this does not solve the bleeding problems then we can consider hemorrhoidal banding in the office as we were discussing prior to your procedure.  You also have a condition called diverticulosis - common and not usually a problem. Please read the handout provided.   I am sorry for the communication difficulties we had but I am glad we got this done because this was a large polyp in the kind that definitely needs to come out.  I appreciate the opportunity to care for you.  Gatha Mayer, MD, FACG   YOU HAD AN ENDOSCOPIC PROCEDURE TODAY: Refer to the procedure report and other information in the discharge instructions given to you for any specific questions about what was found during the examination. If this information does not answer your questions, please call [place] office at [phone] to clarify.   YOU SHOULD EXPECT: Some feelings of bloating in the abdomen. Passage of more gas than usual. Walking can help get rid of the air that was put into your GI tract during the procedure and reduce the bloating. If you had a lower endoscopy (such as a colonoscopy or flexible sigmoidoscopy) you may notice spotting of blood in your stool or on the toilet paper. Some abdominal soreness may be present for a day or two, also.  DIET: Your first meal following the procedure  should be a light meal and then it is ok to progress to your normal diet. A half-sandwich or bowl of soup is an example of a good first meal. Heavy or fried foods are harder to digest and may make you feel nauseous or bloated. Drink plenty of fluids but you should avoid alcoholic beverages for 24 hours. If you had a esophageal dilation, please see attached instructions for diet.    ACTIVITY: Your care partner should take you home directly after the procedure. You should plan to take it easy, moving slowly for the rest of the day. You can resume normal activity the day after the procedure however YOU SHOULD NOT DRIVE, use power tools, machinery or perform tasks that involve climbing or major physical exertion for 24 hours (because of the sedation medicines used during the test).   SYMPTOMS TO REPORT IMMEDIATELY: A gastroenterologist can be reached at any hour. Please call [phone]  for any of the following symptoms:  . Following lower endoscopy (colonoscopy, flexible sigmoidoscopy) Excessive amounts of blood in the stool  Significant tenderness, worsening of abdominal pains  Swelling of the abdomen that is new, acute  Fever of 100 or higher    FOLLOW UP:  If any biopsies were taken you will be contacted by phone or by letter within the next 1-3 weeks. Call [phone]  if you have not  heard about the biopsies in 3 weeks.  Please also call with any specific questions about appointments or follow up tests.

## 2020-05-10 NOTE — Anesthesia Preprocedure Evaluation (Signed)
Anesthesia Evaluation  Patient identified by MRN, date of birth, ID band Patient awake    Reviewed: Allergy & Precautions, NPO status , Patient's Chart, lab work & pertinent test results  History of Anesthesia Complications Negative for: history of anesthetic complications  Airway Mallampati: II  TM Distance: >3 FB Neck ROM: Full    Dental   Pulmonary asthma ,    Pulmonary exam normal        Cardiovascular negative cardio ROS Normal cardiovascular exam     Neuro/Psych Anxiety negative neurological ROS     GI/Hepatic Neg liver ROS, Rectal bleeding   Endo/Other  Hypothyroidism   Renal/GU negative Renal ROS  negative genitourinary   Musculoskeletal negative musculoskeletal ROS (+)   Abdominal   Peds  Hematology negative hematology ROS (+)   Anesthesia Other Findings   Reproductive/Obstetrics                            Anesthesia Physical Anesthesia Plan  ASA: II  Anesthesia Plan: MAC   Post-op Pain Management:    Induction: Intravenous  PONV Risk Score and Plan: 2 and Propofol infusion, TIVA and Treatment may vary due to age or medical condition  Airway Management Planned: Natural Airway, Nasal Cannula and Simple Face Mask  Additional Equipment: None  Intra-op Plan:   Post-operative Plan:   Informed Consent: I have reviewed the patients History and Physical, chart, labs and discussed the procedure including the risks, benefits and alternatives for the proposed anesthesia with the patient or authorized representative who has indicated his/her understanding and acceptance.       Plan Discussed with:   Anesthesia Plan Comments:        Anesthesia Quick Evaluation

## 2020-05-11 ENCOUNTER — Encounter (HOSPITAL_COMMUNITY): Payer: Self-pay | Admitting: Internal Medicine

## 2020-05-11 MED ORDER — SPOT INK MARKER SYRINGE KIT
PACK | SUBMUCOSAL | Status: DC | PRN
  Administered 2020-05-10: 3 mL via SUBMUCOSAL

## 2020-05-12 LAB — SURGICAL PATHOLOGY

## 2020-10-20 LAB — OB RESULTS CONSOLE HEPATITIS B SURFACE ANTIGEN: Hepatitis B Surface Ag: NEGATIVE

## 2020-10-20 LAB — OB RESULTS CONSOLE GC/CHLAMYDIA
Chlamydia: NEGATIVE
Gonorrhea: NEGATIVE

## 2020-10-20 LAB — OB RESULTS CONSOLE HIV ANTIBODY (ROUTINE TESTING): HIV: NONREACTIVE

## 2020-10-20 LAB — OB RESULTS CONSOLE RUBELLA ANTIBODY, IGM: Rubella: IMMUNE

## 2020-10-20 LAB — OB RESULTS CONSOLE RPR: RPR: NONREACTIVE

## 2021-02-11 ENCOUNTER — Other Ambulatory Visit (HOSPITAL_COMMUNITY): Payer: Self-pay | Admitting: Obstetrics and Gynecology

## 2021-02-11 ENCOUNTER — Other Ambulatory Visit: Payer: Self-pay

## 2021-02-11 ENCOUNTER — Ambulatory Visit (HOSPITAL_COMMUNITY)
Admission: RE | Admit: 2021-02-11 | Discharge: 2021-02-11 | Disposition: A | Discharge status: Home / Self Care | Payer: 59 | Source: Ambulatory Visit | Attending: Obstetrics and Gynecology | Admitting: Obstetrics and Gynecology

## 2021-02-11 DIAGNOSIS — M79661 Pain in right lower leg: Secondary | ICD-10-CM | POA: Diagnosis not present

## 2021-02-11 DIAGNOSIS — M7989 Other specified soft tissue disorders: Secondary | ICD-10-CM

## 2021-02-11 NOTE — Progress Notes (Signed)
Lower extremity venous has been completed.   Preliminary results in CV Proc.   Chizara Mena Geanna Divirgilio 02/11/2021 2:18 PM

## 2021-04-10 ENCOUNTER — Inpatient Hospital Stay (HOSPITAL_COMMUNITY)
Admission: AD | Admit: 2021-04-10 | Discharge: 2021-04-10 | Disposition: A | Discharge status: Home / Self Care | Payer: 59 | Attending: Obstetrics and Gynecology | Admitting: Obstetrics and Gynecology

## 2021-04-10 ENCOUNTER — Encounter (HOSPITAL_COMMUNITY): Payer: Self-pay | Admitting: Obstetrics and Gynecology

## 2021-04-10 ENCOUNTER — Other Ambulatory Visit: Payer: Self-pay

## 2021-04-10 DIAGNOSIS — Z3A35 35 weeks gestation of pregnancy: Secondary | ICD-10-CM | POA: Diagnosis not present

## 2021-04-10 DIAGNOSIS — O98513 Other viral diseases complicating pregnancy, third trimester: Secondary | ICD-10-CM | POA: Insufficient documentation

## 2021-04-10 DIAGNOSIS — O09523 Supervision of elderly multigravida, third trimester: Secondary | ICD-10-CM | POA: Diagnosis not present

## 2021-04-10 DIAGNOSIS — U071 COVID-19: Secondary | ICD-10-CM | POA: Insufficient documentation

## 2021-04-10 LAB — URINALYSIS, ROUTINE W REFLEX MICROSCOPIC
Bilirubin Urine: NEGATIVE
Glucose, UA: NEGATIVE mg/dL
Hgb urine dipstick: NEGATIVE
Ketones, ur: NEGATIVE mg/dL
Leukocytes,Ua: NEGATIVE
Nitrite: NEGATIVE
Protein, ur: NEGATIVE mg/dL
Specific Gravity, Urine: 1.02 (ref 1.005–1.030)
pH: 7.5 (ref 5.0–8.0)

## 2021-04-10 LAB — RESP PANEL BY RT-PCR (FLU A&B, COVID) ARPGX2
Influenza A by PCR: NEGATIVE
Influenza B by PCR: NEGATIVE
SARS Coronavirus 2 by RT PCR: POSITIVE — AB

## 2021-04-10 LAB — CBC WITH DIFFERENTIAL/PLATELET
Abs Immature Granulocytes: 0.15 10*3/uL — ABNORMAL HIGH (ref 0.00–0.07)
Basophils Absolute: 0 10*3/uL (ref 0.0–0.1)
Basophils Relative: 0 %
Eosinophils Absolute: 0.1 10*3/uL (ref 0.0–0.5)
Eosinophils Relative: 1 %
HCT: 32.4 % — ABNORMAL LOW (ref 36.0–46.0)
Hemoglobin: 10.6 g/dL — ABNORMAL LOW (ref 12.0–15.0)
Immature Granulocytes: 3 %
Lymphocytes Relative: 5 %
Lymphs Abs: 0.3 10*3/uL — ABNORMAL LOW (ref 0.7–4.0)
MCH: 30.5 pg (ref 26.0–34.0)
MCHC: 32.7 g/dL (ref 30.0–36.0)
MCV: 93.1 fL (ref 80.0–100.0)
Monocytes Absolute: 0.5 10*3/uL (ref 0.1–1.0)
Monocytes Relative: 9 %
Neutro Abs: 4.6 10*3/uL (ref 1.7–7.7)
Neutrophils Relative %: 82 %
Platelets: 151 10*3/uL (ref 150–400)
RBC: 3.48 MIL/uL — ABNORMAL LOW (ref 3.87–5.11)
RDW: 14.1 % (ref 11.5–15.5)
WBC: 5.6 10*3/uL (ref 4.0–10.5)
nRBC: 0 % (ref 0.0–0.2)

## 2021-04-10 LAB — COMPREHENSIVE METABOLIC PANEL
ALT: 13 U/L (ref 0–44)
AST: 28 U/L (ref 15–41)
Albumin: 2.4 g/dL — ABNORMAL LOW (ref 3.5–5.0)
Alkaline Phosphatase: 104 U/L (ref 38–126)
Anion gap: 9 (ref 5–15)
BUN: 6 mg/dL (ref 6–20)
CO2: 20 mmol/L — ABNORMAL LOW (ref 22–32)
Calcium: 9 mg/dL (ref 8.9–10.3)
Chloride: 104 mmol/L (ref 98–111)
Creatinine, Ser: 0.63 mg/dL (ref 0.44–1.00)
GFR, Estimated: >60 mL/min (ref >60)
Glucose, Bld: 94 mg/dL (ref 70–99)
Potassium: 4 mmol/L (ref 3.5–5.1)
Sodium: 133 mmol/L — ABNORMAL LOW (ref 135–145)
Total Bilirubin: 0.3 mg/dL (ref 0.3–1.2)
Total Protein: 5.5 g/dL — ABNORMAL LOW (ref 6.5–8.1)

## 2021-04-10 MED ORDER — ACETAMINOPHEN 500 MG PO TABS
1000.0000 mg | ORAL_TABLET | Freq: Once | ORAL | Status: AC
  Administered 2021-04-10: 1000 mg via ORAL
  Filled 2021-04-10: qty 2

## 2021-04-10 MED ORDER — LACTATED RINGERS IV BOLUS
1000.0000 mL | Freq: Once | INTRAVENOUS | Status: AC
  Administered 2021-04-10: 15:00:00 1000 mL via INTRAVENOUS

## 2021-04-10 MED ORDER — LACTATED RINGERS IV BOLUS
1000.0000 mL | Freq: Once | INTRAVENOUS | Status: AC
  Administered 2021-04-10: 13:00:00 1000 mL via INTRAVENOUS

## 2021-04-10 MED ORDER — NIRMATRELVIR/RITONAVIR (PAXLOVID)TABLET: 3.0000 | ORAL_TABLET | Freq: Two times a day (BID) | ORAL | 0 refills | Status: AC

## 2021-04-10 MED ORDER — BENZONATATE 100 MG PO CAPS: 100.0000 mg | ORAL_CAPSULE | Freq: Three times a day (TID) | ORAL | 0 refills | Status: DC

## 2021-04-10 MED ORDER — CYCLOBENZAPRINE HCL 5 MG PO TABS
10.0000 mg | ORAL_TABLET | Freq: Once | ORAL | Status: AC
  Administered 2021-04-10: 15:00:00 10 mg via ORAL
  Filled 2021-04-10: qty 2

## 2021-04-10 MED ORDER — FLUTICASONE PROPIONATE 50 MCG/ACT NA SUSP: 1.0000 | Freq: Every day | NASAL | 0 refills | Status: AC

## 2021-04-10 NOTE — MAU Provider Note (Addendum)
History     CSN: [id number]  Arrival date and time: 04/10/21 1226   Event Date/Time   First Provider Initiated Contact with Patient 04/10/21 1328      Chief Complaint  Patient presents with   Generalized Body Aches   Decreased Fetal Movement   Fever   HPI Wendy Clements is a 35 y.o. [id number] at [redacted]w[redacted]d who presents with fever, chills, and generalized body aches. She reports at home her highest temp was 101.8 and tried tylenol with some relief. She also reports a headache that she rates a 10/10 and the tylenol did not help. She reports the body aches are radiating down her back and legs. She feels generally weak and states she has never felt this bad before. She reports influenza in November, no known exposure to [place] but she is a Theme park manager. She denies any bleeding or leaking. She reports decreased fetal movement today but is feeling movement since her arrival to MAU.  OB History     Gravida  4   Para  1   Term  1   Preterm      AB  2   Living  1      SAB  1   IAB  1   Ectopic      Multiple  0   Live Births  1           Past Medical History:  Diagnosis Date   Asthma    Hx of varicella    Hypothyroidism    OCD (obsessive compulsive disorder)    Vaginal Pap smear, abnormal     Past Surgical History:  Procedure Laterality Date   COLONOSCOPY WITH PROPOFOL N/A 05/10/2020   Procedure: COLONOSCOPY WITH PROPOFOL;  Surgeon: Gatha Mayer, MD;  Location: [facility];  Service: Endoscopy;  Laterality: N/A;   HEMOSTASIS CLIP PLACEMENT  05/10/2020   Procedure: HEMOSTASIS CLIP PLACEMENT;  Surgeon: Gatha Mayer, MD;  Location: [facility];  Service: Endoscopy;;   LEEP     POLYPECTOMY  05/10/2020   Procedure: POLYPECTOMY;  Surgeon: Gatha Mayer, MD;  Location: [facility];  Service: Endoscopy;;   SCLEROTHERAPY  05/10/2020   Procedure: Clide Deutscher;  Surgeon: Gatha Mayer, MD;  Location: [facility];  Service: Endoscopy;;    Family History   Problem Relation Age of Onset   Cancer Maternal Grandmother     Social History   Tobacco Use   Smoking status: Never   Smokeless tobacco: Never  Vaping Use   Vaping Use: Never used  Substance Use Topics   Alcohol use: Yes   Drug use: No    Allergies: No Known Allergies  Medications Prior to Admission  Medication Sig Dispense Refill Last Dose   albuterol (PROVENTIL HFA;VENTOLIN HFA) 108 (90 Base) MCG/ACT inhaler Inhale 1-2 puffs into the lungs every 6 (six) hours as needed for wheezing or shortness of breath.      ibuprofen (ADVIL) 600 MG tablet Take 1 tablet (600 mg total) by mouth every 6 (six) hours. DO NOT USE AGAIN UNTIL 05/25/2020 30 tablet 0    montelukast (SINGULAIR) 10 MG tablet Take 10 mg by mouth at bedtime.       Review of Systems  Constitutional:  Positive for chills, fatigue and fever.  HENT:  Positive for congestion and sore throat.   Respiratory:  Positive for cough. Negative for shortness of breath.   Cardiovascular: Negative.  Negative for chest pain.  Gastrointestinal: Negative.  Negative for abdominal pain,  constipation, diarrhea, nausea and vomiting.  Genitourinary: Negative.  Negative for dysuria, vaginal bleeding and vaginal discharge.  Musculoskeletal:  Positive for neck stiffness.  Neurological:  Positive for headaches. Negative for dizziness.  Physical Exam   Blood pressure 107/61, pulse (!) 112, temperature 99.8 F (37.7 C), temperature source Oral, resp. rate 16, height 5\' 2"  (1.575 m), weight 78.5 kg, last menstrual period 04/26/2020, SpO2 99 %.  Physical Exam Vitals and nursing note reviewed.  Constitutional:      General: She is not in acute distress.    Appearance: She is well-developed.  HENT:     Head: Normocephalic.  Eyes:     Pupils: Pupils are equal, round, and reactive to light.  Cardiovascular:     Rate and Rhythm: Normal rate and regular rhythm.     Heart sounds: Normal heart sounds.  Pulmonary:     Effort: Pulmonary effort is  normal. No respiratory distress.     Breath sounds: Normal breath sounds.  Abdominal:     General: Bowel sounds are normal. There is no distension.     Palpations: Abdomen is soft.     Tenderness: There is no abdominal tenderness.  Skin:    General: Skin is warm and dry.  Neurological:     Mental Status: She is alert and oriented to person, place, and time.  Psychiatric:        Mood and Affect: Mood normal.        Behavior: Behavior normal.        Thought Content: Thought content normal.        Judgment: Judgment normal.   Fetal Tracing:  Baseline:  145 Variability: moderate Accels: 15x15 Decels: none  Toco: none  MAU Course  Procedures Results for orders placed or performed during the hospital encounter of 04/10/21 (from the past 24 hour(s))  Urinalysis, Routine w reflex microscopic Nasopharyngeal Swab     Status: None   Collection Time: 04/10/21 12:46 PM  Result Value Ref Range   Color, Urine YELLOW YELLOW   APPearance CLEAR CLEAR   Specific Gravity, Urine 1.020 1.005 - 1.030   pH 7.5 5.0 - 8.0   Glucose, UA NEGATIVE NEGATIVE mg/dL   Hgb urine dipstick NEGATIVE NEGATIVE   Bilirubin Urine NEGATIVE NEGATIVE   Ketones, ur NEGATIVE NEGATIVE mg/dL   Protein, ur NEGATIVE NEGATIVE mg/dL   Nitrite NEGATIVE NEGATIVE   Leukocytes,Ua NEGATIVE NEGATIVE  CBC with Differential/Platelet     Status: Abnormal   Collection Time: 04/10/21 12:56 PM  Result Value Ref Range   WBC 5.6 4.0 - 10.5 K/uL   RBC 3.48 (L) 3.87 - 5.11 MIL/uL   Hemoglobin 10.6 (L) 12.0 - 15.0 g/dL   HCT 32.4 (L) 36.0 - 46.0 %   MCV 93.1 80.0 - 100.0 fL   MCH 30.5 26.0 - 34.0 pg   MCHC 32.7 30.0 - 36.0 g/dL   RDW 14.1 11.5 - 15.5 %   Platelets 151 150 - 400 K/uL   nRBC 0.0 0.0 - 0.2 %   Neutrophils Relative % 82 %   Neutro Abs 4.6 1.7 - 7.7 K/uL   Lymphocytes Relative 5 %   Lymphs Abs 0.3 (L) 0.7 - 4.0 K/uL   Monocytes Relative 9 %   Monocytes Absolute 0.5 0.1 - 1.0 K/uL   Eosinophils Relative 1 %    Eosinophils Absolute 0.1 0.0 - 0.5 K/uL   Basophils Relative 0 %   Basophils Absolute 0.0 0.0 - 0.1 K/uL   Immature Granulocytes  3 %   Abs Immature Granulocytes 0.15 (H) 0.00 - 0.07 K/uL  Comprehensive metabolic panel     Status: Abnormal   Collection Time: 04/10/21 12:56 PM  Result Value Ref Range   Sodium 133 (L) 135 - 145 mmol/L   Potassium 4.0 3.5 - 5.1 mmol/L   Chloride 104 98 - 111 mmol/L   CO2 20 (L) 22 - 32 mmol/L   Glucose, Bld 94 70 - 99 mg/dL   BUN 6 6 - 20 mg/dL   Creatinine, Ser 0.63 0.44 - 1.00 mg/dL   Calcium 9.0 8.9 - 10.3 mg/dL   Total Protein 5.5 (L) 6.5 - 8.1 g/dL   Albumin 2.4 (L) 3.5 - 5.0 g/dL   AST 28 15 - 41 U/L   ALT 13 0 - 44 U/L   Alkaline Phosphatase 104 38 - 126 U/L   Total Bilirubin 0.3 0.3 - 1.2 mg/dL   GFR, Estimated >60 >60 mL/min   Anion gap 9 5 - 15  Resp Panel by RT-PCR (Flu A&B, Covid) Nasopharyngeal Swab     Status: Abnormal   Collection Time: 04/10/21  1:21 PM   Specimen: Nasopharyngeal Swab; Nasopharyngeal(NP) swabs in vial transport medium  Result Value Ref Range   SARS Coronavirus 2 by RT PCR POSITIVE (A) NEGATIVE   Influenza A by PCR NEGATIVE NEGATIVE   Influenza B by PCR NEGATIVE NEGATIVE    MDM UA COVID/flu test CBC, CMP LR bolus x2 Tylenol PO Flexeril PO  Reviewed results of positive COVID test with patient. Discussed typical course of virus and what to expect. Informed patient that symptoms tend to worsen on days 5-8 and reviewed safe medications for symptom management. Warning signs of when to return to MAU reviewed at length including shortness of breath, chest pain and decreased fetal movement. Instructed patient to quarantine for 10 days and any office appointments will be changed to virtual or rescheduled to outside of the quarantine window. Dr. Murrell Redden notified of patient diagnosis.   Discussed Paxlovid with patient and support person. Reviewed limited data in pregnancy and data regarding COVID infection in pregnancy.  Patient desires RX to be sent to pharmacy.   Assessment and Plan   1. COVID-19 affecting pregnancy in third trimester   2. [redacted] weeks gestation of pregnancy    -Discharge home in stable condition -Rx for Paxlovid, flonase, and tessalon perles sent to [facility] precautions discussed -Patient advised to follow-up with OB as scheduled for prenatal care -Patient may return to MAU as needed or if her condition were to change or worsen   Wende Mott CNM 04/10/2021, 1:28 PM

## 2021-04-10 NOTE — MAU Note (Signed)
Wendy Clements is a 35 y.o. at [redacted]w[redacted]d here in MAU reporting: last night started shivering and felt like she had no control over her neck and couldn't speak. Temp last night 101.8, has had tylenol on board but last dose with this morning. States she is having aching down her back and into her legs. Feels weak. DFM today. No bleeding or LOF.  Onset of complaint: yesterday  Pain score: 8/10  Vitals:   04/10/21 1246  BP: (!) 103/57  Pulse: (!) 134  Resp: 20  Temp: 100 F (37.8 C)  SpO2: 97%     FHT:173  Lab orders placed from triage: ua, resp panel

## 2021-04-10 NOTE — Discharge Instructions (Signed)

## 2021-04-22 ENCOUNTER — Inpatient Hospital Stay (HOSPITAL_COMMUNITY): Payer: 59

## 2021-04-22 ENCOUNTER — Encounter (HOSPITAL_COMMUNITY): Payer: Self-pay | Admitting: Obstetrics and Gynecology

## 2021-04-22 ENCOUNTER — Other Ambulatory Visit: Payer: Self-pay

## 2021-04-22 ENCOUNTER — Inpatient Hospital Stay (EMERGENCY_DEPARTMENT_HOSPITAL)
Admission: AD | Admit: 2021-04-22 | Discharge: 2021-04-23 | Disposition: A | Discharge status: Home / Self Care | Payer: 59 | Source: Home / Self Care | Attending: Obstetrics and Gynecology | Admitting: Obstetrics and Gynecology

## 2021-04-22 DIAGNOSIS — Z3A37 37 weeks gestation of pregnancy: Secondary | ICD-10-CM

## 2021-04-22 DIAGNOSIS — J069 Acute upper respiratory infection, unspecified: Secondary | ICD-10-CM | POA: Diagnosis not present

## 2021-04-22 DIAGNOSIS — O368130 Decreased fetal movements, third trimester, not applicable or unspecified: Secondary | ICD-10-CM | POA: Insufficient documentation

## 2021-04-22 DIAGNOSIS — O26893 Other specified pregnancy related conditions, third trimester: Secondary | ICD-10-CM | POA: Diagnosis not present

## 2021-04-22 DIAGNOSIS — R519 Headache, unspecified: Secondary | ICD-10-CM

## 2021-04-22 DIAGNOSIS — O99513 Diseases of the respiratory system complicating pregnancy, third trimester: Secondary | ICD-10-CM | POA: Insufficient documentation

## 2021-04-22 DIAGNOSIS — O09523 Supervision of elderly multigravida, third trimester: Secondary | ICD-10-CM | POA: Insufficient documentation

## 2021-04-22 DIAGNOSIS — R509 Fever, unspecified: Secondary | ICD-10-CM | POA: Insufficient documentation

## 2021-04-22 DIAGNOSIS — Z20822 Contact with and (suspected) exposure to covid-19: Secondary | ICD-10-CM | POA: Insufficient documentation

## 2021-04-22 LAB — URINALYSIS, ROUTINE W REFLEX MICROSCOPIC
Bilirubin Urine: NEGATIVE
Glucose, UA: 50 mg/dL — AB
Hgb urine dipstick: NEGATIVE
Ketones, ur: 5 mg/dL — AB
Leukocytes,Ua: NEGATIVE
Nitrite: NEGATIVE
Protein, ur: NEGATIVE mg/dL
Specific Gravity, Urine: 1.009 (ref 1.005–1.030)
pH: 7 (ref 5.0–8.0)

## 2021-04-22 LAB — COMPREHENSIVE METABOLIC PANEL
ALT: 16 U/L (ref 0–44)
AST: 20 U/L (ref 15–41)
Albumin: 2.3 g/dL — ABNORMAL LOW (ref 3.5–5.0)
Alkaline Phosphatase: 127 U/L — ABNORMAL HIGH (ref 38–126)
Anion gap: 12 (ref 5–15)
BUN: <5 mg/dL — ABNORMAL LOW (ref 6–20)
CO2: 18 mmol/L — ABNORMAL LOW (ref 22–32)
Calcium: 8.4 mg/dL — ABNORMAL LOW (ref 8.9–10.3)
Chloride: 104 mmol/L (ref 98–111)
Creatinine, Ser: 0.62 mg/dL (ref 0.44–1.00)
GFR, Estimated: >60 mL/min (ref >60)
Glucose, Bld: 95 mg/dL (ref 70–99)
Potassium: 3.6 mmol/L (ref 3.5–5.1)
Sodium: 134 mmol/L — ABNORMAL LOW (ref 135–145)
Total Bilirubin: 0.6 mg/dL (ref 0.3–1.2)
Total Protein: 5.6 g/dL — ABNORMAL LOW (ref 6.5–8.1)

## 2021-04-22 LAB — CBC
HCT: 32 % — ABNORMAL LOW (ref 36.0–46.0)
Hemoglobin: 11.2 g/dL — ABNORMAL LOW (ref 12.0–15.0)
MCH: 31.5 pg (ref 26.0–34.0)
MCHC: 35 g/dL (ref 30.0–36.0)
MCV: 90.1 fL (ref 80.0–100.0)
Platelets: 177 10*3/uL (ref 150–400)
RBC: 3.55 MIL/uL — ABNORMAL LOW (ref 3.87–5.11)
RDW: 14 % (ref 11.5–15.5)
WBC: 11.9 10*3/uL — ABNORMAL HIGH (ref 4.0–10.5)
nRBC: 0 % (ref 0.0–0.2)

## 2021-04-22 LAB — RESP PANEL BY RT-PCR (FLU A&B, COVID) ARPGX2
Influenza A by PCR: NEGATIVE
Influenza B by PCR: NEGATIVE
SARS Coronavirus 2 by RT PCR: NEGATIVE

## 2021-04-22 LAB — GROUP A STREP BY PCR: Group A Strep by PCR: NOT DETECTED

## 2021-04-22 IMAGING — DX DG CHEST 1V PORT
1 series · 1 of 1 positions shown · non-contrast
Comparison: None.

CLINICAL DATA: Shortness of breath and fever.

EXAM:
PORTABLE CHEST 1 VIEW patient is pregnant and shielded for exam.

[chest ap]
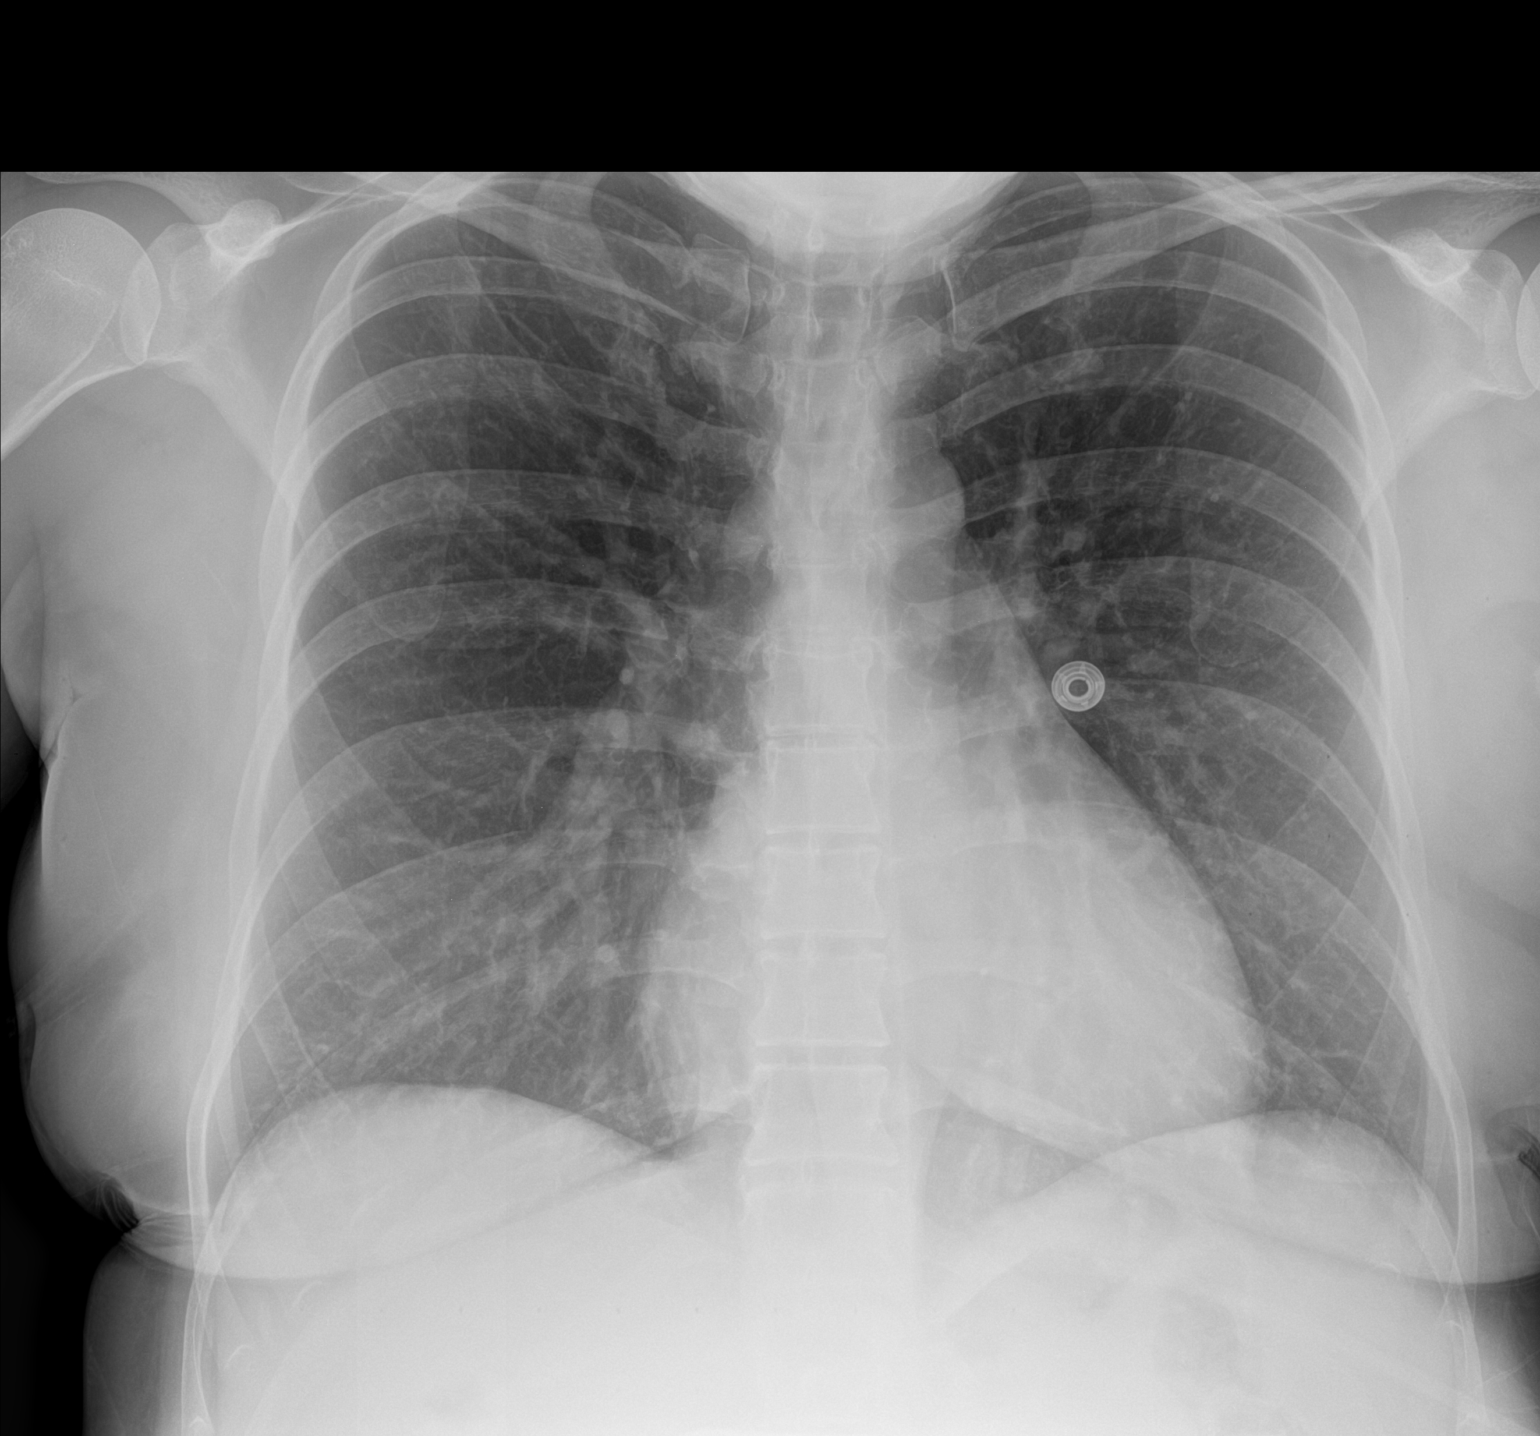

[1 of 1 positions shown; findings below may reference images not displayed]

FINDINGS: The heart size and mediastinal contours are within normal limits.
Both lungs are clear. The visualized skeletal structures are
unremarkable.
IMPRESSION: No active disease.

## 2021-04-22 IMAGING — MR MR [PERSON_NAME] HEAD
3 series · 42 of 48 positions shown · non-contrast
Comparison: None available.

CLINICAL DATA: Initial evaluation for acute headache.

EXAM:
MR VENOGRAM HEAD WITHOUT CONTRAST
TECHNIQUE: Angiographic images of the intracranial venous structures were
acquired using MRV technique without intravenous contrast.

[Series 5: tof_fl2d_paracor · coronal · 2.0mm · 0.98mm/px · 12 of 137 slices shown]
[im 1/137]
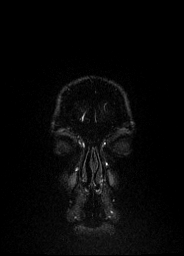
[im 13/137]
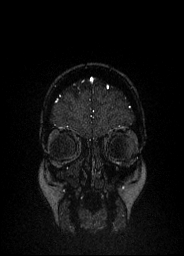
[im 25/137]
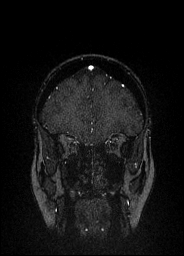
[im 38/137]
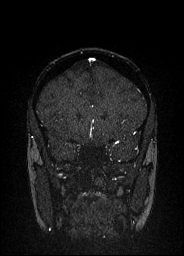
[im 50/137]
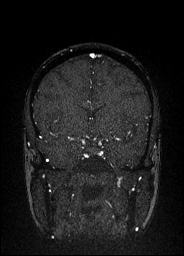
[im 62/137]
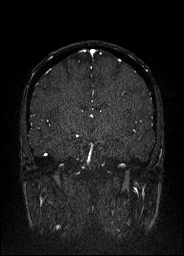
[im 75/137]
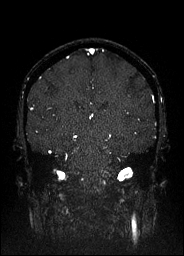
[im 87/137]
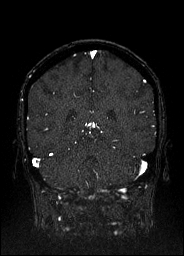
[im 99/137]
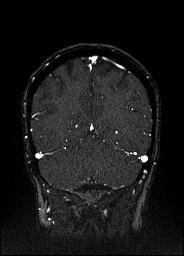
[im 112/137]
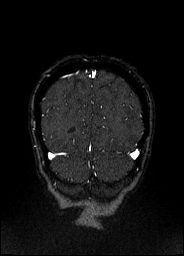
[im 124/137]
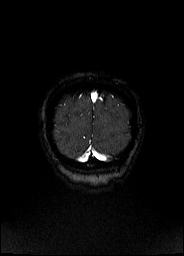
[im 137/137]
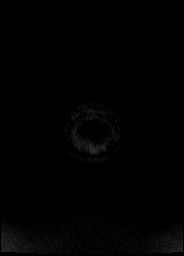

[Series 9: venous inhance coronal · coronal · portal-venous · 0.9mm · 0.57mm/px · 18 of 208 slices shown]
[im 1/208]
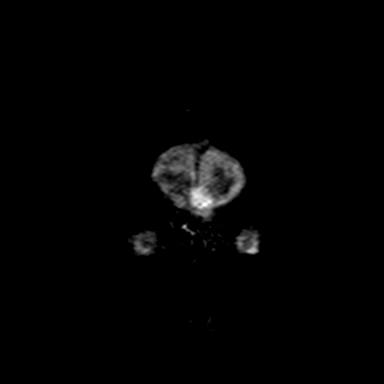
[im 13/208]
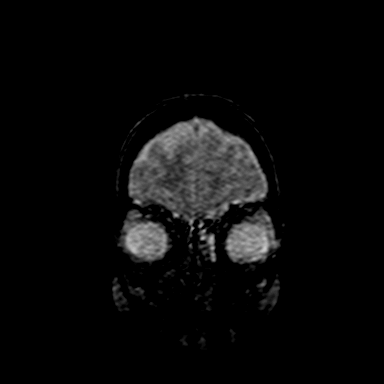
[im 25/208]
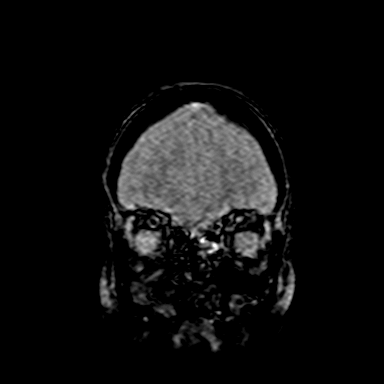
[im 37/208]
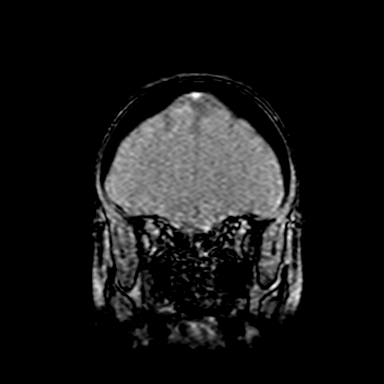
[im 49/208]
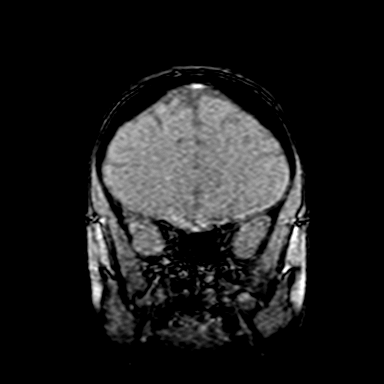
[im 61/208]
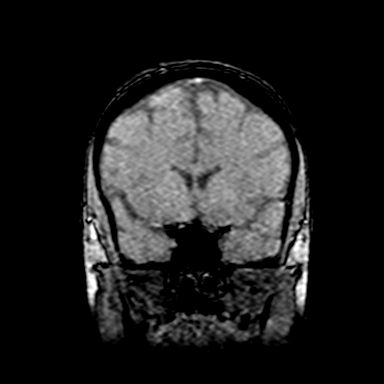
[im 74/208]
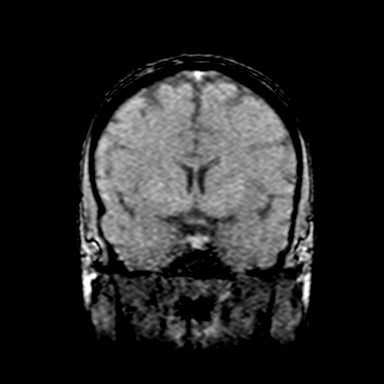
[im 86/208]
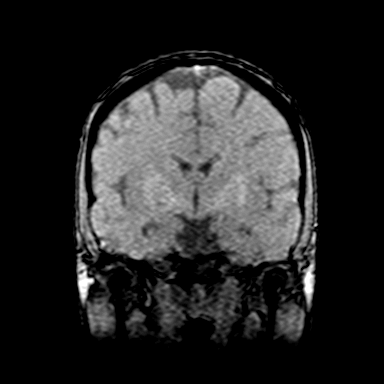
[im 98/208]
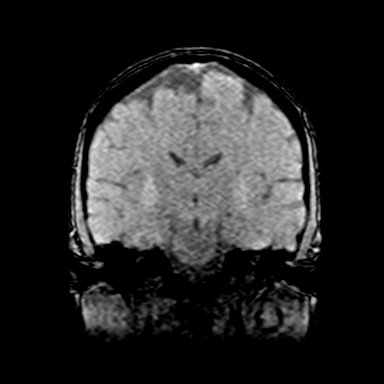
[im 110/208]
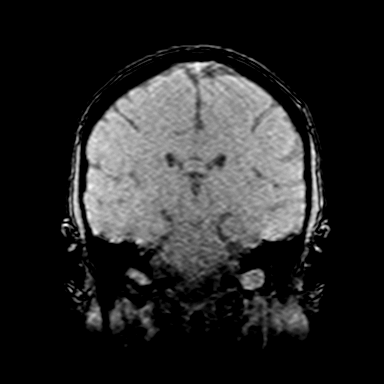
[im 122/208]
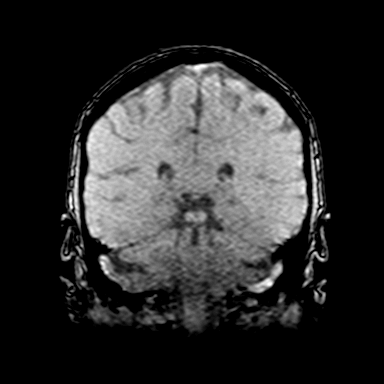
[im 134/208]
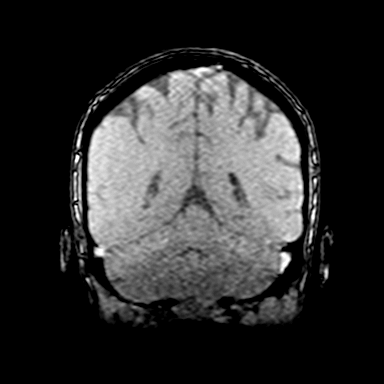
[im 147/208]
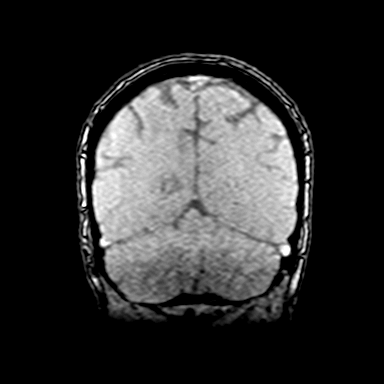
[im 159/208]
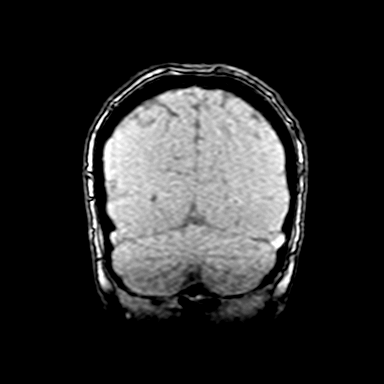
[im 171/208]
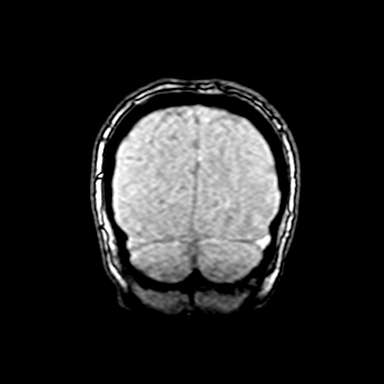
[im 183/208]
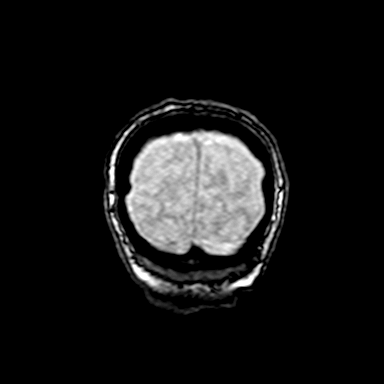
[im 195/208]
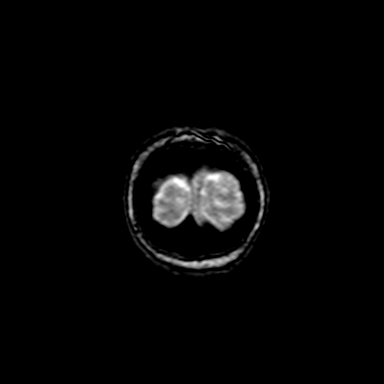
[im 208/208]
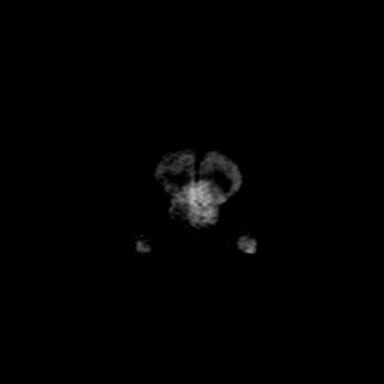

[Series 10: venous inhance coronal_msum · coronal · portal-venous · 0.9mm · 0.57mm/px · 12 of 204 slices shown]
[im 1/204]
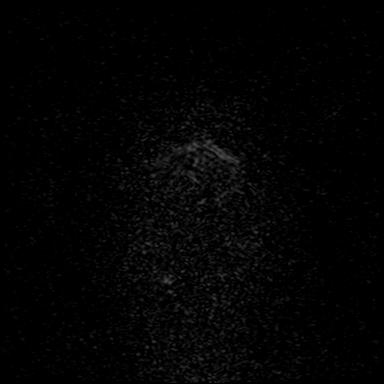
[im 12/204]
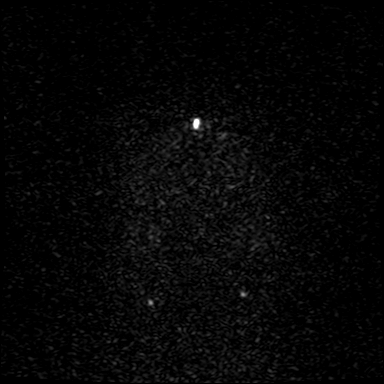
[im 24/204]
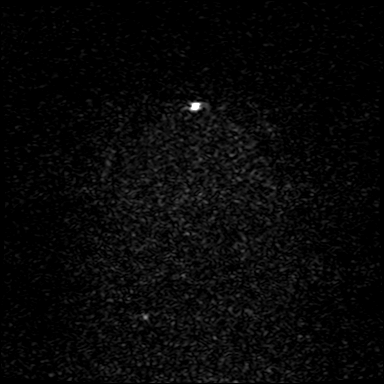
[im 36/204]
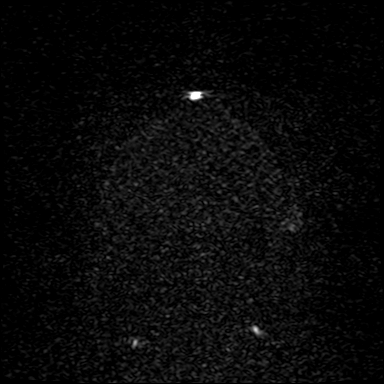
[im 60/204]
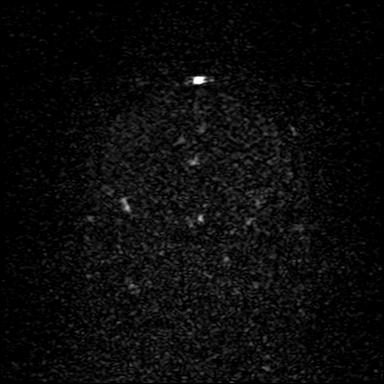
[im 84/204]
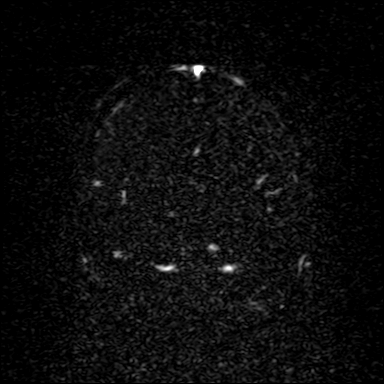
[im 108/204]
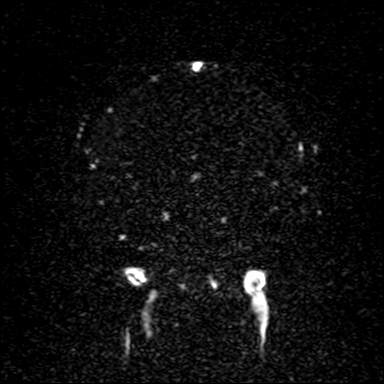
[im 120/204]
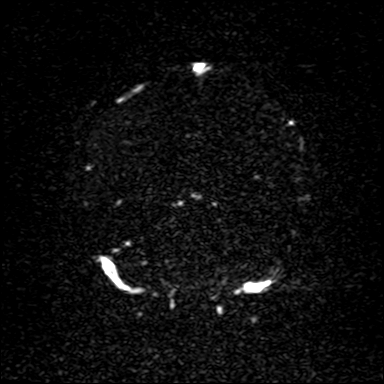
[im 144/204]
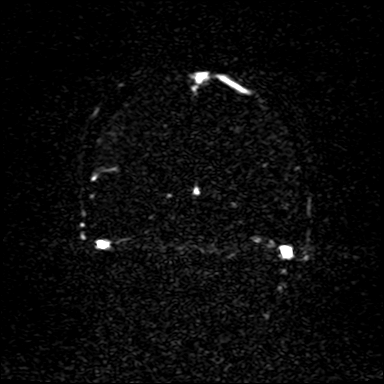
[im 168/204]
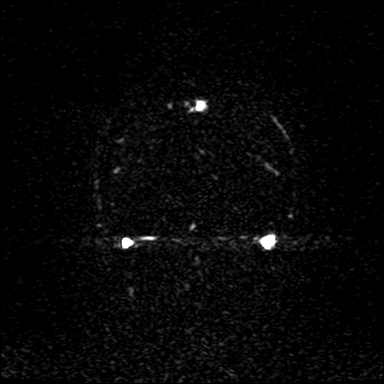
[im 180/204]
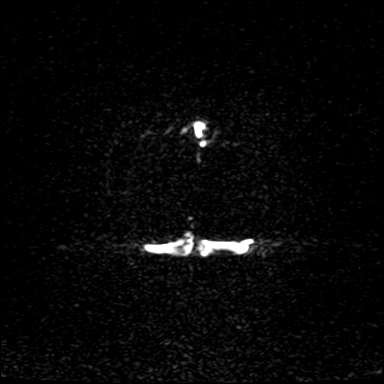
[im 192/204]
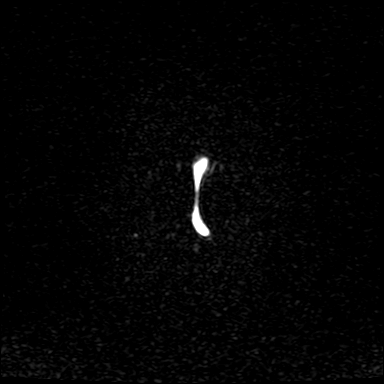

[42 of 48 positions shown; findings below may reference images not displayed]

FINDINGS: Normal flow related signal seen throughout the superior sagittal
sinus to the torcula. Transverse and sigmoid sinuses are widely
patent as are the visualized proximal internal jugular veins.
Straight sinus, vein of CHINTAN, internal cerebral veins, and basal
veins of CHINTAN appear patent. No dural sinus thrombosis. No
appreciable dural sinus stenosis. No appreciable abnormality about
the cavernous sinus. No visible cortical venous thrombosis.
IMPRESSION: Normal intracranial MRV.

## 2021-04-22 MED ORDER — METOCLOPRAMIDE HCL 5 MG/ML IJ SOLN
10.0000 mg | Freq: Once | INTRAMUSCULAR | Status: AC
  Administered 2021-04-22: 18:00:00 10 mg via INTRAVENOUS
  Filled 2021-04-22: qty 2

## 2021-04-22 MED ORDER — DIPHENHYDRAMINE HCL 50 MG/ML IJ SOLN
25.0000 mg | Freq: Once | INTRAMUSCULAR | Status: AC
  Administered 2021-04-22: 18:00:00 25 mg via INTRAVENOUS
  Filled 2021-04-22: qty 1

## 2021-04-22 MED ORDER — LACTATED RINGERS IV BOLUS
1000.0000 mL | Freq: Once | INTRAVENOUS | Status: AC
  Administered 2021-04-22: 15:00:00 1000 mL via INTRAVENOUS

## 2021-04-22 MED ORDER — CYCLOBENZAPRINE HCL 10 MG PO TABS: 10.0000 mg | ORAL_TABLET | Freq: Two times a day (BID) | ORAL | 0 refills | Status: AC | PRN

## 2021-04-22 MED ORDER — LACTATED RINGERS IV BOLUS
1000.0000 mL | Freq: Once | INTRAVENOUS | Status: AC
  Administered 2021-04-22: 14:00:00 1000 mL via INTRAVENOUS

## 2021-04-22 MED ORDER — CYCLOBENZAPRINE HCL 5 MG PO TABS
10.0000 mg | ORAL_TABLET | Freq: Once | ORAL | Status: AC
  Administered 2021-04-22: 14:00:00 10 mg via ORAL
  Filled 2021-04-22: qty 2

## 2021-04-22 MED ORDER — BUTALBITAL-APAP-CAFFEINE 50-325-40 MG PO TABS
2.0000 | ORAL_TABLET | ORAL | Status: DC | PRN
  Administered 2021-04-22: 20:00:00 2 via ORAL
  Filled 2021-04-22: qty 2

## 2021-04-22 MED ORDER — ACETAMINOPHEN 325 MG PO TABS
650.0000 mg | ORAL_TABLET | Freq: Once | ORAL | Status: AC
  Administered 2021-04-22: 14:00:00 650 mg via ORAL
  Filled 2021-04-22: qty 2

## 2021-04-22 NOTE — MAU Provider Note (Addendum)
Patient Wendy Clements is a 35 y.o. [phone]  At [redacted]w[redacted]d here with complaints of fever, back pain and decreased fetal movements. She also endorses a HA. She denies blurry vision, floating spots, RUQ pain. She denies cough, chest pain, SOB. She denies sore throat.   She reports decreased fetal movements today, but denies LOF, VB. 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  She took 1000 mg of tylenol this morning. Her temp was 102 at home.  History     CSN: [id number]  Arrival date and time: 04/22/21 1237   Event Date/Time   First Provider Initiated Contact with Patient 04/22/21 1323      Chief Complaint  Patient presents with   Decreased Fetal Movement   Fever   Fever  This is a new problem. The current episode started today. The problem occurs constantly. The maximum temperature noted was 102 to 102.9 F. The temperature was taken using a tympanic thermometer. Associated symptoms include headaches and muscle aches. Pertinent negatives include no abdominal pain, chest pain, congestion, coughing, diarrhea, sore throat, urinary pain or vomiting. Associated symptoms comments: Vaginal pain.  Headache  This is a new problem. The current episode started today. The problem has been gradually worsening. The pain is located in the Right unilateral region. The pain does not radiate. The pain quality is not similar to prior headaches. The quality of the pain is described as pulsating. The pain is at a severity of 8/10. Associated symptoms include a fever and muscle aches. Pertinent negatives include no abdominal pain, coughing, sore throat or vomiting.   OB History     Gravida  4   Para  1   Term  1   Preterm      AB  2   Living  1      SAB  1   IAB  1   Ectopic      Multiple  0   Live Births  1           Past Medical History:  Diagnosis Date   Asthma    Hx of varicella    Hypothyroidism    OCD  (obsessive compulsive disorder)    Vaginal Pap smear, abnormal     Past Surgical History:  Procedure Laterality Date   COLONOSCOPY WITH PROPOFOL N/A 05/10/2020   Procedure: COLONOSCOPY WITH PROPOFOL;  Surgeon: Gatha Mayer, MD;  Location: [facility];  Service: Endoscopy;  Laterality: N/A;   HEMOSTASIS CLIP PLACEMENT  05/10/2020   Procedure: HEMOSTASIS CLIP PLACEMENT;  Surgeon: Gatha Mayer, MD;  Location: [facility];  Service: Endoscopy;;   LEEP     POLYPECTOMY  05/10/2020   Procedure: POLYPECTOMY;  Surgeon: Gatha Mayer, MD;  Location: [facility];  Service: Endoscopy;;   SCLEROTHERAPY  05/10/2020   Procedure: Clide Deutscher;  Surgeon: Gatha Mayer, MD;  Location: [facility];  Service: Endoscopy;;    Family History  Problem Relation Age of Onset   Cancer Maternal Grandmother     Social History   Tobacco Use   Smoking status: Never   Smokeless tobacco: Never  Vaping Use   Vaping Use: Never used  Substance Use Topics   Alcohol use: Yes   Drug use: No    Allergies: No Known Allergies  Medications Prior to Admission  Medication Sig Dispense Refill Last Dose   albuterol (PROVENTIL HFA;VENTOLIN HFA) 108 (90 Base) MCG/ACT inhaler Inhale  1-2 puffs into the lungs every 6 (six) hours as needed for wheezing or shortness of breath.      benzonatate (TESSALON) 100 MG capsule Take 1 capsule (100 mg total) by mouth every 8 (eight) hours. 30 capsule 0    fluticasone (FLONASE) 50 MCG/ACT nasal spray Place 1 spray into both nostrils daily. 11.1 mL 0    montelukast (SINGULAIR) 10 MG tablet Take 10 mg by mouth at bedtime.       Review of Systems  Constitutional:  Positive for fever.  HENT:  Negative for congestion and sore throat.   Respiratory:  Negative for cough.   Cardiovascular:  Negative for chest pain.  Gastrointestinal:  Negative for abdominal pain, diarrhea and vomiting.  Genitourinary:  Negative for dysuria.  Neurological:  Positive for headaches.  Physical  Exam   Blood pressure (!) 105/52, pulse (!) 101, temperature (!) 100.7 F (38.2 C), resp. rate 20, height 5\' 2"  (1.575 m), weight 79.9 kg, last menstrual period 04/26/2020, SpO2 100 %.  Physical Exam Constitutional:      Appearance: Normal appearance.  Cardiovascular:     Rate and Rhythm: Tachycardia present.  Pulmonary:     Effort: Pulmonary effort is normal.     Breath sounds: Normal breath sounds.  Abdominal:     General: Abdomen is flat.  Musculoskeletal:        General: Normal range of motion.  Skin:    General: Skin is warm.  Neurological:     General: No focal deficit present.     Mental Status: She is alert.  Patient has no CVA tenderness, nor tenderness over her bladder.   MAU Course  Procedures  MDM -NST: 155 bpm, mod var, present acel, no decels, irregular contractions.   UA shows 5 ketones, no nitrites  -discussed patients labs with Dr. Elonda Husky, will order chest x-ray to rule out lobar pneumonia> lungs are clear, normal chest x-ray  Reassessment (4:45 PM) -Flu and COVID negative -will collect Strep A   Patient feels stronger fetal movements while in MAU  Reassessment (8:58 PM) -Strep A negative  Patient endorsed sudden headache this afternoon; she received Fioricet, Benadryl, Reglan and pain only went from 8 to 5/10. SHe has also eaten and received 2L of fluid.   Still with HA; will order MRI   Reassessment (9:28 PM) Patient now in MRI, waiting to be scanned.    Patient care endorsed to Laury Deep at [facility] 04/22/2021, 4:45 PM   Reassessment (11:50 PM) H/A rated 4/10. Patient and support person awaiting MRI results  *Consult with Dr. Elgie Congo @ 2353 - notified of patient's complaints, assessments, lab & CXR and MRV results, recommended tx plan d/c home, F/U with OB provider  Results for orders placed or performed during the hospital encounter of 04/22/21 (from the past 24 hour(s))  Urinalysis, Routine w reflex microscopic  Urine, Clean Catch     Status: Abnormal   Collection Time: 04/22/21  1:05 PM  Result Value Ref Range   Color, Urine YELLOW YELLOW   APPearance CLEAR CLEAR   Specific Gravity, Urine 1.009 1.005 - 1.030   pH 7.0 5.0 - 8.0   Glucose, UA 50 (A) NEGATIVE mg/dL   Hgb urine dipstick NEGATIVE NEGATIVE   Bilirubin Urine NEGATIVE NEGATIVE   Ketones, ur 5 (A) NEGATIVE mg/dL   Protein, ur NEGATIVE NEGATIVE mg/dL   Nitrite NEGATIVE NEGATIVE   Leukocytes,Ua NEGATIVE NEGATIVE  CBC     Status: Abnormal  Collection Time: 04/22/21  1:49 PM  Result Value Ref Range   WBC 11.9 (H) 4.0 - 10.5 K/uL   RBC 3.55 (L) 3.87 - 5.11 MIL/uL   Hemoglobin 11.2 (L) 12.0 - 15.0 g/dL   HCT 32.0 (L) 36.0 - 46.0 %   MCV 90.1 80.0 - 100.0 fL   MCH 31.5 26.0 - 34.0 pg   MCHC 35.0 30.0 - 36.0 g/dL   RDW 14.0 11.5 - 15.5 %   Platelets 177 150 - 400 K/uL   nRBC 0.0 0.0 - 0.2 %  Comprehensive metabolic panel     Status: Abnormal   Collection Time: 04/22/21  1:49 PM  Result Value Ref Range   Sodium 134 (L) 135 - 145 mmol/L   Potassium 3.6 3.5 - 5.1 mmol/L   Chloride 104 98 - 111 mmol/L   CO2 18 (L) 22 - 32 mmol/L   Glucose, Bld 95 70 - 99 mg/dL   BUN <5 (L) 6 - 20 mg/dL   Creatinine, Ser 0.62 0.44 - 1.00 mg/dL   Calcium 8.4 (L) 8.9 - 10.3 mg/dL   Total Protein 5.6 (L) 6.5 - 8.1 g/dL   Albumin 2.3 (L) 3.5 - 5.0 g/dL   AST 20 15 - 41 U/L   ALT 16 0 - 44 U/L   Alkaline Phosphatase 127 (H) 38 - 126 U/L   Total Bilirubin 0.6 0.3 - 1.2 mg/dL   GFR, Estimated >60 >60 mL/min   Anion gap 12 5 - 15  Resp Panel by RT-PCR (Flu A&B, Covid) Nasopharyngeal Swab     Status: None   Collection Time: 04/22/21  2:22 PM   Specimen: Nasopharyngeal Swab; Nasopharyngeal(NP) swabs in vial transport medium  Result Value Ref Range   SARS Coronavirus 2 by RT PCR NEGATIVE NEGATIVE   Influenza A by PCR NEGATIVE NEGATIVE   Influenza B by PCR NEGATIVE NEGATIVE  Group A Strep by PCR     Status: None   Collection Time: 04/22/21  4:00 PM    Specimen: Throat; Sterile Swab  Result Value Ref Range   Group A Strep by PCR NOT DETECTED NOT DETECTED    MR Venogram Head  Result Date: 04/22/2021 CLINICAL DATA:  Initial evaluation for acute headache. EXAM: MR VENOGRAM HEAD WITHOUT CONTRAST TECHNIQUE: Angiographic images of the intracranial venous structures were acquired using MRV technique without intravenous contrast. COMPARISON:  None available. FINDINGS: Normal flow related signal seen throughout the superior sagittal sinus to the torcula. Transverse and sigmoid sinuses are widely patent as are the visualized proximal internal jugular veins. Straight sinus, vein of Galen, internal cerebral veins, and basal veins of Rosenthal appear patent. No dural sinus thrombosis. No appreciable dural sinus stenosis. No appreciable abnormality about the cavernous sinus. No visible cortical venous thrombosis. IMPRESSION: Normal intracranial MRV. Electronically Signed   By: Jeannine Boga M.D.   On: 04/22/2021 23:46   DG CHEST PORT 1 VIEW  Result Date: 04/22/2021 CLINICAL DATA:  Shortness of breath and fever. EXAM: PORTABLE CHEST 1 VIEW patient is pregnant and shielded for exam. COMPARISON:  None. FINDINGS: The heart size and mediastinal contours are within normal limits. Both lungs are clear. The visualized skeletal structures are unremarkable. IMPRESSION: No active disease. Electronically Signed   By: Abelardo Diesel M.D.   On: 04/22/2021 15:42    Assessment and Plan  Upper respiratory tract infection, unspecified type  - Information provided on URI - List of safe meds in pregnancy given  Headache in pregnancy, antepartum, third trimester  -  Rx for Flexeril 10 mg BID prn - Information provided on headache without cause   [redacted] weeks gestation of pregnancy   - Discharge patient - Keep scheduled appt with [facility] on 04/27/2021 - Patient verbalized an understanding of the plan of care and agrees.    Laury Deep, CNM  04/22/2021 11:21 PM

## 2021-04-22 NOTE — MAU Note (Signed)
...  Wendy Clements is a 35 y.o. at [redacted]w[redacted]d here in MAU reporting: DFM since yesterday. Woke up around 0400 this morning to a temperature of 99.8 F and her last temp was 67 F prior to leaving her house on the way here. She states for the last few days she has had complications emptying her bladder and states she is experiencing vaginal pressure. She is also experiencing mid back pain that radiates down the middle of her spine. No VB or LOF.  Last dose of Tylenol: 1000 mg @ 0900 this morning  Pain scores:  8/10 back 8/10 vagina  FHT: 141 doppler Lab orders placed from triage: UA

## 2021-04-22 NOTE — Discharge Instructions (Signed)

## 2021-04-23 ENCOUNTER — Other Ambulatory Visit: Payer: Self-pay

## 2021-04-23 ENCOUNTER — Inpatient Hospital Stay (HOSPITAL_COMMUNITY): Payer: 59 | Admitting: Anesthesiology

## 2021-04-23 ENCOUNTER — Encounter (HOSPITAL_COMMUNITY): Payer: Self-pay | Admitting: Obstetrics and Gynecology

## 2021-04-23 ENCOUNTER — Inpatient Hospital Stay (HOSPITAL_COMMUNITY)
Admission: AD | Admit: 2021-04-23 | Discharge: 2021-04-26 | DRG: 806 | Disposition: A | Discharge status: Home / Self Care | Payer: 59 | Attending: Obstetrics and Gynecology | Admitting: Obstetrics and Gynecology

## 2021-04-23 DIAGNOSIS — R1032 Left lower quadrant pain: Secondary | ICD-10-CM | POA: Diagnosis present

## 2021-04-23 DIAGNOSIS — Z3A37 37 weeks gestation of pregnancy: Secondary | ICD-10-CM

## 2021-04-23 DIAGNOSIS — O3663X0 Maternal care for excessive fetal growth, third trimester, not applicable or unspecified: Secondary | ICD-10-CM | POA: Diagnosis present

## 2021-04-23 DIAGNOSIS — E039 Hypothyroidism, unspecified: Secondary | ICD-10-CM | POA: Diagnosis present

## 2021-04-23 DIAGNOSIS — Z20822 Contact with and (suspected) exposure to covid-19: Secondary | ICD-10-CM | POA: Diagnosis present

## 2021-04-23 DIAGNOSIS — F429 Obsessive-compulsive disorder, unspecified: Secondary | ICD-10-CM | POA: Diagnosis present

## 2021-04-23 DIAGNOSIS — R509 Fever, unspecified: Secondary | ICD-10-CM | POA: Diagnosis present

## 2021-04-23 DIAGNOSIS — O26893 Other specified pregnancy related conditions, third trimester: Secondary | ICD-10-CM | POA: Diagnosis present

## 2021-04-23 DIAGNOSIS — O99892 Other specified diseases and conditions complicating childbirth: Secondary | ICD-10-CM | POA: Diagnosis present

## 2021-04-23 LAB — CBC WITH DIFFERENTIAL/PLATELET
Abs Immature Granulocytes: 0.05 10*3/uL (ref 0.00–0.07)
Basophils Absolute: 0 10*3/uL (ref 0.0–0.1)
Basophils Relative: 0 %
Eosinophils Absolute: 0 10*3/uL (ref 0.0–0.5)
Eosinophils Relative: 0 %
HCT: 34.7 % — ABNORMAL LOW (ref 36.0–46.0)
Hemoglobin: 12 g/dL (ref 12.0–15.0)
Immature Granulocytes: 1 %
Lymphocytes Relative: 9 %
Lymphs Abs: 0.8 10*3/uL (ref 0.7–4.0)
MCH: 31.6 pg (ref 26.0–34.0)
MCHC: 34.6 g/dL (ref 30.0–36.0)
MCV: 91.3 fL (ref 80.0–100.0)
Monocytes Absolute: 0.5 10*3/uL (ref 0.1–1.0)
Monocytes Relative: 5 %
Neutro Abs: 7.7 10*3/uL (ref 1.7–7.7)
Neutrophils Relative %: 85 %
Platelets: 196 10*3/uL (ref 150–400)
RBC: 3.8 MIL/uL — ABNORMAL LOW (ref 3.87–5.11)
RDW: 14.3 % (ref 11.5–15.5)
WBC: 9.1 10*3/uL (ref 4.0–10.5)
nRBC: 0 % (ref 0.0–0.2)

## 2021-04-23 LAB — COMPREHENSIVE METABOLIC PANEL
ALT: 17 U/L (ref 0–44)
AST: 25 U/L (ref 15–41)
Albumin: 2.5 g/dL — ABNORMAL LOW (ref 3.5–5.0)
Alkaline Phosphatase: 151 U/L — ABNORMAL HIGH (ref 38–126)
Anion gap: 10 (ref 5–15)
BUN: <5 mg/dL — ABNORMAL LOW (ref 6–20)
CO2: 20 mmol/L — ABNORMAL LOW (ref 22–32)
Calcium: 8.3 mg/dL — ABNORMAL LOW (ref 8.9–10.3)
Chloride: 102 mmol/L (ref 98–111)
Creatinine, Ser: 0.65 mg/dL (ref 0.44–1.00)
GFR, Estimated: >60 mL/min (ref >60)
Glucose, Bld: 94 mg/dL (ref 70–99)
Potassium: 3.7 mmol/L (ref 3.5–5.1)
Sodium: 132 mmol/L — ABNORMAL LOW (ref 135–145)
Total Bilirubin: 0.6 mg/dL (ref 0.3–1.2)
Total Protein: 6.2 g/dL — ABNORMAL LOW (ref 6.5–8.1)

## 2021-04-23 LAB — CBC
HCT: 32.4 % — ABNORMAL LOW (ref 36.0–46.0)
Hemoglobin: 10.7 g/dL — ABNORMAL LOW (ref 12.0–15.0)
MCH: 30.4 pg (ref 26.0–34.0)
MCHC: 33 g/dL (ref 30.0–36.0)
MCV: 92 fL (ref 80.0–100.0)
Platelets: 199 10*3/uL (ref 150–400)
RBC: 3.52 MIL/uL — ABNORMAL LOW (ref 3.87–5.11)
RDW: 14.4 % (ref 11.5–15.5)
WBC: 12.8 10*3/uL — ABNORMAL HIGH (ref 4.0–10.5)
nRBC: 0 % (ref 0.0–0.2)

## 2021-04-23 LAB — URINALYSIS, ROUTINE W REFLEX MICROSCOPIC
Bilirubin Urine: NEGATIVE
Glucose, UA: NEGATIVE mg/dL
Hgb urine dipstick: NEGATIVE
Ketones, ur: 5 mg/dL — AB
Leukocytes,Ua: NEGATIVE
Nitrite: NEGATIVE
Protein, ur: NEGATIVE mg/dL
Specific Gravity, Urine: 1.013 (ref 1.005–1.030)
pH: 9 — ABNORMAL HIGH (ref 5.0–8.0)

## 2021-04-23 LAB — TYPE AND SCREEN
ABO/RH(D): O POS
Antibody Screen: NEGATIVE

## 2021-04-23 LAB — LACTIC ACID, PLASMA: Lactic Acid, Venous: 1.5 mmol/L (ref 0.5–1.9)

## 2021-04-23 MED ORDER — DIPHENHYDRAMINE HCL 50 MG/ML IJ SOLN: 12.5000 mg | INTRAMUSCULAR | Status: DC | PRN

## 2021-04-23 MED ORDER — SODIUM CHLORIDE 0.9 % IV SOLN
2.0000 g | Freq: Once | INTRAVENOUS | Status: AC
  Administered 2021-04-23: 2 g via INTRAVENOUS
  Filled 2021-04-23: qty 2000

## 2021-04-23 MED ORDER — ENSURE MAX PROTEIN PO LIQD
11.0000 [oz_av] | Freq: Two times a day (BID) | ORAL | Status: DC
  Filled 2021-04-23: qty 330

## 2021-04-23 MED ORDER — SODIUM CHLORIDE 0.9 % IV SOLN
1.0000 g | INTRAVENOUS | Status: DC
  Administered 2021-04-24 (×4): 1 g via INTRAVENOUS
  Filled 2021-04-23 (×4): qty 1000

## 2021-04-23 MED ORDER — ACETAMINOPHEN 325 MG PO TABS: 650.0000 mg | ORAL_TABLET | ORAL | Status: DC | PRN

## 2021-04-23 MED ORDER — ZOLPIDEM TARTRATE 5 MG PO TABS: 5.0000 mg | ORAL_TABLET | Freq: Every evening | ORAL | Status: DC | PRN

## 2021-04-23 MED ORDER — SODIUM CHLORIDE 0.9% FLUSH: 3.0000 mL | INTRAVENOUS | Status: DC | PRN

## 2021-04-23 MED ORDER — LIDOCAINE HCL (PF) 1 % IJ SOLN: 30.0000 mL | INTRAMUSCULAR | Status: DC | PRN

## 2021-04-23 MED ORDER — PRENATAL MULTIVITAMIN CH: 1.0000 | ORAL_TABLET | Freq: Every day | ORAL | Status: DC

## 2021-04-23 MED ORDER — LACTATED RINGERS IV SOLN: INTRAVENOUS | Status: DC

## 2021-04-23 MED ORDER — ACETAMINOPHEN 500 MG PO TABS: 1000.0000 mg | ORAL_TABLET | Freq: Four times a day (QID) | ORAL | Status: DC | PRN

## 2021-04-23 MED ORDER — OXYTOCIN BOLUS FROM INFUSION
333.0000 mL | Freq: Once | INTRAVENOUS | Status: AC
  Administered 2021-04-24: 333 mL via INTRAVENOUS

## 2021-04-23 MED ORDER — LACTATED RINGERS IV SOLN
500.0000 mL | INTRAVENOUS | Status: DC | PRN
  Administered 2021-04-24: 500 mL via INTRAVENOUS

## 2021-04-23 MED ORDER — ONDANSETRON HCL 4 MG/2ML IJ SOLN: 4.0000 mg | Freq: Four times a day (QID) | INTRAMUSCULAR | Status: DC | PRN

## 2021-04-23 MED ORDER — OXYTOCIN-SODIUM CHLORIDE 30-0.9 UT/500ML-% IV SOLN
2.5000 [IU]/h | INTRAVENOUS | Status: DC
  Administered 2021-04-24: 2.5 [IU]/h via INTRAVENOUS
  Filled 2021-04-23 (×2): qty 500

## 2021-04-23 MED ORDER — DOCUSATE SODIUM 100 MG PO CAPS: 100.0000 mg | ORAL_CAPSULE | Freq: Every day | ORAL | Status: DC

## 2021-04-23 MED ORDER — SOD CITRATE-CITRIC ACID 500-334 MG/5ML PO SOLN: 30.0000 mL | ORAL | Status: DC | PRN

## 2021-04-23 MED ORDER — LACTATED RINGERS IV BOLUS
1000.0000 mL | Freq: Once | INTRAVENOUS | Status: AC
  Administered 2021-04-23: 1000 mL via INTRAVENOUS

## 2021-04-23 MED ORDER — PHENYLEPHRINE 40 MCG/ML (10ML) SYRINGE FOR IV PUSH (FOR BLOOD PRESSURE SUPPORT)
80.0000 ug | PREFILLED_SYRINGE | INTRAVENOUS | Status: DC | PRN
  Administered 2021-04-24: 80 ug via INTRAVENOUS

## 2021-04-23 MED ORDER — EPHEDRINE 5 MG/ML INJ
10.0000 mg | INTRAVENOUS | Status: DC | PRN
  Administered 2021-04-24: 10 mg via INTRAVENOUS

## 2021-04-23 MED ORDER — ACETAMINOPHEN 500 MG PO TABS
1000.0000 mg | ORAL_TABLET | Freq: Once | ORAL | Status: AC
  Administered 2021-04-23: 1000 mg via ORAL
  Filled 2021-04-23: qty 2

## 2021-04-23 MED ORDER — ONDANSETRON HCL 4 MG/2ML IJ SOLN: 4.0000 mg | Freq: Once | INTRAMUSCULAR | Status: DC

## 2021-04-23 MED ORDER — FENTANYL-BUPIVACAINE-NACL 0.5-0.125-0.9 MG/250ML-% EP SOLN
12.0000 mL/h | EPIDURAL | Status: DC | PRN
  Administered 2021-04-24: 12 mL/h via EPIDURAL
  Filled 2021-04-23 (×2): qty 250

## 2021-04-23 MED ORDER — SODIUM CHLORIDE 0.9 % IV SOLN: 250.0000 mL | INTRAVENOUS | Status: DC | PRN

## 2021-04-23 MED ORDER — OXYTOCIN 10 UNIT/ML IJ SOLN
10.0000 [IU] | Freq: Once | INTRAMUSCULAR | Status: DC
  Filled 2021-04-23: qty 1

## 2021-04-23 MED ORDER — CALCIUM CARBONATE ANTACID 500 MG PO CHEW: 2.0000 | CHEWABLE_TABLET | ORAL | Status: DC | PRN

## 2021-04-23 MED ORDER — LACTATED RINGERS IV SOLN
500.0000 mL | Freq: Once | INTRAVENOUS | Status: AC
  Administered 2021-04-23: 500 mL via INTRAVENOUS

## 2021-04-23 MED ORDER — ACETAMINOPHEN 500 MG PO TABS
1000.0000 mg | ORAL_TABLET | Freq: Four times a day (QID) | ORAL | Status: DC | PRN
  Administered 2021-04-24: 1000 mg via ORAL
  Filled 2021-04-23: qty 2

## 2021-04-23 MED ORDER — HYDROXYZINE HCL 50 MG PO TABS
25.0000 mg | ORAL_TABLET | Freq: Four times a day (QID) | ORAL | Status: DC | PRN
  Administered 2021-04-23: 50 mg via ORAL
  Filled 2021-04-23: qty 1

## 2021-04-23 MED ORDER — FENTANYL CITRATE (PF) 100 MCG/2ML IJ SOLN: 50.0000 ug | INTRAMUSCULAR | Status: DC | PRN

## 2021-04-23 MED ORDER — EPHEDRINE 5 MG/ML INJ
10.0000 mg | INTRAVENOUS | Status: DC | PRN
  Filled 2021-04-23: qty 5

## 2021-04-23 MED ORDER — PHENYLEPHRINE 40 MCG/ML (10ML) SYRINGE FOR IV PUSH (FOR BLOOD PRESSURE SUPPORT)
80.0000 ug | PREFILLED_SYRINGE | INTRAVENOUS | Status: DC | PRN
  Filled 2021-04-23 (×3): qty 10

## 2021-04-23 MED ORDER — SODIUM CHLORIDE 0.9% FLUSH: 3.0000 mL | Freq: Two times a day (BID) | INTRAVENOUS | Status: DC

## 2021-04-23 NOTE — H&P (Signed)
Wendy Clements is a 35 y.o. [id number] at [redacted]w[redacted]d gestation presents for complaint of Contractions, aches.  No lof, no vb.  +fm.  Contractions worse around 11 am today, better now, less frequent, occasionally painful but frequent and painful earlier today.  Says was feeling "aches" but thought d/t painful contractions.  On arrival to MAU informed of fever 101.5.  Given tylenol and now feeling better - aches resolved.  Pt was evaluated in MAU last night d/t fever 102 and severe h/a.  Neg cxr and neg mri.  No urinary sx and u/a not done, no urine concerning for Pt received IVF and was feeling better at discharge at around 0100 this am. Flu/covid neg/strep a neg; given flexeril for h/a; suspected viral etiology for fever and precautions given.  Pt did test positive for covid 12/18 and these sx resolved. Today, she denies dysuria, has been having more frequent urination but feels doesn't void as much as usual - says did have a normal void after getting the ivf last night.   Antepartum course:  LGA , MCI; efw at 36.6 wga 6'6" (91%), ac 99%   [facility] at [facility] since 10 wks, Dr. Lanny Cramp.  See complete pre-natal records  History OB History     Gravida  4   Para  1   Term  1   Preterm      AB  2   Living  1      SAB  1   IAB  1   Ectopic      Multiple  0   Live Births  1          Past Medical History:  Diagnosis Date   Asthma    Hx of varicella    Hypothyroidism    OCD (obsessive compulsive disorder)    Vaginal Pap smear, abnormal    Past Surgical History:  Procedure Laterality Date   COLONOSCOPY WITH PROPOFOL N/A 05/10/2020   Procedure: COLONOSCOPY WITH PROPOFOL;  Surgeon: Gatha Mayer, MD;  Location: [facility];  Service: Endoscopy;  Laterality: N/A;   HEMOSTASIS CLIP PLACEMENT  05/10/2020   Procedure: HEMOSTASIS CLIP PLACEMENT;  Surgeon: Gatha Mayer, MD;  Location: [facility];  Service: Endoscopy;;   LEEP     POLYPECTOMY  05/10/2020   Procedure:  POLYPECTOMY;  Surgeon: Gatha Mayer, MD;  Location: [facility];  Service: Endoscopy;;   SCLEROTHERAPY  05/10/2020   Procedure: Clide Deutscher;  Surgeon: Gatha Mayer, MD;  Location: [facility];  Service: Endoscopy;;   Family History: family history includes Cancer in her maternal grandmother. Social History:  reports that she has never smoked. She has never used smokeless tobacco. She reports that she does not currently use alcohol. She reports that she does not use drugs.  ROS: See above otherwise negative  Prenatal labs:  ABO, Rh: --/--/O POS (12/31 1505) Antibody: NEG (12/31 1505) Rubella:  NI RPR:   neg HBsAg:   neg HIV:  NR GBS:  pending  1 hr Glucola: Normal Genetic screening: Normal Anatomy US: Normal  Physical Exam:   Dilation: 1 Effacement (%): Thick Exam by:: Elray Mcgregor, RN Blood pressure (!) 107/56, pulse (!) 106, temperature 99.8 F (37.7 C), temperature source Oral, resp. rate 19, last menstrual period 04/26/2020, SpO2 97 %. A&O x 3 HEENT: Normal Lungs: CTAB CV: RRR Abdominal: Soft, Non-tender, and Gravid Lower Extremities: Non-edematous, Non-tender  Pelvic Exam:      Deferred - see nurse exam, stable from prior check  Labs:  CBC:  Lab Results  Component Value Date   WBC 9.1 04/23/2021   RBC 3.80 (L) 04/23/2021   HGB 12.0 04/23/2021   HCT 34.7 (L) 04/23/2021   MCV 91.3 04/23/2021   MCH 31.6 04/23/2021   MCHC 34.6 04/23/2021   RDW 14.3 04/23/2021   PLT 196 04/23/2021   CMP:  Lab Results  Component Value Date   NA 132 (L) 04/23/2021   K 3.7 04/23/2021   CL 102 04/23/2021   CO2 20 (L) 04/23/2021   GLUCOSE 94 04/23/2021   BUN <5 (L) 04/23/2021   CREATININE 0.65 04/23/2021   CALCIUM 8.3 (L) 04/23/2021   PROT 6.2 (L) 04/23/2021   AST 25 04/23/2021   ALT 17 04/23/2021   ALBUMIN 2.5 (L) 04/23/2021   ALKPHOS 151 (H) 04/23/2021   BILITOT 0.6 04/23/2021   GFRNONAA >60 04/23/2021   ANIONGAP 10 04/23/2021   Urine: Lab Results   Component Value Date   COLORURINE YELLOW 04/23/2021   APPEARANCEUR CLEAR 04/23/2021   LABSPEC 1.013 04/23/2021   PHURINE 9.0 (H) 04/23/2021   GLUCOSEU NEGATIVE 04/23/2021   HGBUR NEGATIVE 04/23/2021   BILIRUBINUR NEGATIVE 04/23/2021   KETONESUR 5 (A) 04/23/2021   PROTEINUR NEGATIVE 04/23/2021   NITRITE NEGATIVE 04/23/2021   LEUKOCYTESUR NEGATIVE 04/23/2021     Prenatal Transfer Tool  Maternal Diabetes: No Genetic Screening: Normal Maternal Ultrasounds/Referrals: Normal Fetal Ultrasounds or other Referrals:  None Maternal Substance Abuse:  No Significant Maternal Medications:  None Significant Maternal Lab Results: gbs pending  FHT: 140s, nml variability, +accels, no decels (initial baseline 180s) Toco: irregular, q 1-5 min, becoming more irregular  Assessment/Plan:  35 y.o. [id number] at [redacted]w[redacted]d gestation   Contractions - uncertain if d/t dehydration or latent labor, will admit for observation; fever has now resolved, s/p tylenol and plan IVF; plan vistaril prn ctx since no cervical change but follow closely for change to active labor Fever - Suspected viral etiology - wbc nml today, will monitor for further sx, urine culture sent from MAU and blood cultures were also sent from MAU; supportive measures and follow to see if sx persist; no h/a, no dysuria, no back pain, no congestion, no abdominal pain except from ctx and this has improved - even declining pain management for contractions; will monitor closely MCI  LGA - 6'6" with AC 99%; proven pelvis to 7'13" Fetal status reassuring - initially tachy with admission but improved with IVF/tylenol; plan continuous monitoring Ama - nml nipt, boy    Charyl Bigger 04/23/2021, 4:49 PM

## 2021-04-23 NOTE — MAU Provider Note (Signed)
Patient Wendy Clements is a 35 y.o. [phone] at [redacted]w[redacted]d here with complaints of on-going fever and now with strong abdominal pain. She denies VB, loss of fluid, SOB, HA, diarrhea/constipation or dysuria. Patient was seen in MAU yesterday for fever and HA; patient had normal chest-xray, negative flu, strep, COVID and negative MRI for HA.   She reports that she felt better upon discharge last night but then around 11 am this morning she started to feel strong abdominal pain and feverish. Fever was 101 at home.  History     CSN: [id number]  Arrival date and time: 04/23/21 1441   None     Chief Complaint  Patient presents with   Contractions   HPI  OB History     Gravida  4   Para  1   Term  1   Preterm      AB  2   Living  1      SAB  1   IAB  1   Ectopic      Multiple  0   Live Births  1           Past Medical History:  Diagnosis Date   Asthma    Hx of varicella    Hypothyroidism    OCD (obsessive compulsive disorder)    Vaginal Pap smear, abnormal     Past Surgical History:  Procedure Laterality Date   COLONOSCOPY WITH PROPOFOL N/A 05/10/2020   Procedure: COLONOSCOPY WITH PROPOFOL;  Surgeon: Gatha Mayer, MD;  [facility];  Service: Endoscopy;  Laterality: N/A;   HEMOSTASIS CLIP PLACEMENT  05/10/2020   Procedure: HEMOSTASIS CLIP PLACEMENT;  Surgeon: Gatha Mayer, MD;  [facility];  Service: Endoscopy;;   LEEP     POLYPECTOMY  05/10/2020   Procedure: POLYPECTOMY;  Surgeon: Gatha Mayer, MD;  [facility];  Service: Endoscopy;;   SCLEROTHERAPY  05/10/2020   Procedure: Clide Deutscher;  Surgeon: Gatha Mayer, MD;  [facility];  Service: Endoscopy;;    Family History  Problem Relation Age of Onset   Cancer Maternal Grandmother     Social History   Tobacco Use   Smoking status: Never   Smokeless tobacco: Never  Vaping Use   Vaping Use: Never used  Substance Use Topics   Alcohol use: Yes    Drug use: No    Allergies: No Known Allergies  Medications Prior to Admission  Medication Sig Dispense Refill Last Dose   albuterol (PROVENTIL HFA;VENTOLIN HFA) 108 (90 Base) MCG/ACT inhaler Inhale 1-2 puffs into the lungs every 6 (six) hours as needed for wheezing or shortness of breath.      benzonatate (TESSALON) 100 MG capsule Take 1 capsule (100 mg total) by mouth every 8 (eight) hours. 30 capsule 0    cyclobenzaprine (FLEXERIL) 10 MG tablet Take 1 tablet (10 mg total) by mouth 2 (two) times daily as needed for muscle spasms. 20 tablet 0    fluticasone (FLONASE) 50 MCG/ACT nasal spray Place 1 spray into both nostrils daily. 11.1 mL 0    montelukast (SINGULAIR) 10 MG tablet Take 10 mg by mouth at bedtime.       Review of Systems  Constitutional: Negative.   HENT: Negative.    Respiratory: Negative.    Gastrointestinal:  Positive for abdominal pain.  Genitourinary: Negative.   Musculoskeletal:  Negative for neck pain and neck stiffness.  Skin:        She appears flushed  Neurological: Negative.   Psychiatric/Behavioral:  Negative for agitation. The patient is nervous/anxious.   Physical Exam   Blood pressure 114/67, pulse (!) 108, temperature (!) 101.5 F (38.6 C), resp. rate (!) 24, last menstrual period 04/26/2020, SpO2 100 %.  Physical Exam Abdominal:     General: Abdomen is flat.     Palpations: There is no mass.     Tenderness: There is no abdominal tenderness. There is no guarding.  Musculoskeletal:        General: Normal range of motion.  Skin:    General: Skin is warm and dry.     Comments: She appears flushed  Neurological:     General: No focal deficit present.     Mental Status: She is alert.    MAU Course  Procedures  MDM --NST: 180 bpm, mod var, present acel, no decels, concerning for fetal tachycardia.  Patient received 1 gram tylenol and IV LR bolus, TC to Dr. Murrell Redden to suggest admission for fever of unknown origin and concern for fetal  well-being. Dr. Murrell Redden en route to see patient.   Assessment and Plan  -Admit to [facility] -Admission orders placed, will also order Zofran, blood cultures,  CBC with diff pending, CMP pending Dr. Murrell Redden assumes care of patient at [facility] 04/23/2021, 3:28 PM

## 2021-04-23 NOTE — MAU Note (Signed)
Pt reports ctx's since 1130.  Denies vaginal bleeding, Denies LOF.    Reports +FM

## 2021-04-23 NOTE — Anesthesia Procedure Notes (Signed)
Epidural Patient location during procedure: OB Start time: 04/23/2021 9:30 PM End time: 04/23/2021 9:36 PM  Staffing Anesthesiologist: Effie Berkshire, MD Performed: anesthesiologist   Preanesthetic Checklist Completed: patient identified, IV checked, site marked, risks and benefits discussed, surgical consent, monitors and equipment checked, pre-op evaluation and timeout performed  Epidural Patient position: sitting Prep: DuraPrep Patient monitoring: heart rate, continuous pulse ox and blood pressure Approach: midline Location: L3-L4 Injection technique: LOR saline  Needle:  Needle type: Tuohy  Needle gauge: 17 G Needle length: 9 cm Catheter type: closed end flexible Catheter size: 20 Guage Test dose: negative and 1.5% lidocaine  Assessment Events: blood not aspirated, injection not painful, no injection resistance and no paresthesia  Additional Notes LOR @ 4.5  Patient identified. Risks/Benefits/Options discussed with patient including but not limited to bleeding, infection, nerve damage, paralysis, failed block, incomplete pain control, headache, blood pressure changes, nausea, vomiting, reactions to medications, itching and postpartum back pain. Confirmed with bedside nurse the patient's most recent platelet count. Confirmed with patient that they are not currently taking any anticoagulation, have any bleeding history or any family history of bleeding disorders. Patient expressed understanding and wished to proceed. All questions were answered. Sterile technique was used throughout the entire procedure. Please see nursing notes for vital signs. Test dose was given through epidural catheter and negative prior to continuing to dose epidural or start infusion. Warning signs of high block given to the patient including shortness of breath, tingling/numbness in hands, complete motor block, or any concerning symptoms with instructions to call for help. Patient was given instructions  on fall risk and not to get out of bed. All questions and concerns addressed with instructions to call with any issues or inadequate analgesia.    Reason for block:procedure for pain

## 2021-04-23 NOTE — Progress Notes (Signed)
S: Requested by MD to assess patient for labor.   Upon entering room, patient clutching the bed rails and breathing through contractions. Partner, Lytle Michaels, at the bedside providing support. Patient requesting epidural. Eagerly anticipating a baby boy with undecided name.   O: Vitals:   04/23/21 1445 04/23/21 1448 04/23/21 1552  BP:  114/67 (!) 107/56  Pulse:  (!) 108 (!) 106  Resp: (!) 24  19  Temp: (!) 101.5 F (38.6 C)  99.8 F (37.7 C)  TempSrc:   Oral  SpO2: 100%  97%   FHT:  FHR: 150 bpm, variability: moderate,  accelerations:  Present,  decelerations:  Absent UC:   regular, every 2-3 minutes SVE:   Dilation: 3 Effacement (%): 60 Station: -2 Exam by:: Gavin Potters, CNM  A / P: Spontaneous labor, progressing normally  Fetal Wellbeing:  Category I Pain Control:  Labor support without medications Anticipated MOD:  NSVD  Consult with Dr. Murrell Redden. Will transfer patient to L&D and get epidural. L&D charge nurse notified.   Suzan Nailer, CNM, MSN 04/23/2021, 8:51 PM

## 2021-04-23 NOTE — Anesthesia Preprocedure Evaluation (Addendum)
Anesthesia Evaluation  Patient identified by MRN, date of birth, ID band Patient awake    Reviewed: Allergy & Precautions, NPO status , Patient's Chart, lab work & pertinent test results  Airway Mallampati: I       Dental no notable dental hx.    Pulmonary asthma ,    Pulmonary exam normal        Cardiovascular negative cardio ROS Normal cardiovascular exam     Neuro/Psych PSYCHIATRIC DISORDERS Anxiety    GI/Hepatic   Endo/Other  Hypothyroidism   Renal/GU      Musculoskeletal   Abdominal   Peds  Hematology   Anesthesia Other Findings   Reproductive/Obstetrics (+) Pregnancy                            Anesthesia Physical Anesthesia Plan  ASA: 2  Anesthesia Plan: Epidural   Post-op Pain Management:    Induction:   PONV Risk Score and Plan: 0  Airway Management Planned: Natural Airway  Additional Equipment: None  Intra-op Plan:   Post-operative Plan:   Informed Consent: I have reviewed the patients History and Physical, chart, labs and discussed the procedure including the risks, benefits and alternatives for the proposed anesthesia with the patient or authorized representative who has indicated his/her understanding and acceptance.       Plan Discussed with:   Anesthesia Plan Comments: (Lab Results      Component                Value               Date                      WBC                      9.1                 04/23/2021                HGB                      12.0                04/23/2021                HCT                      34.7 (L)            04/23/2021                MCV                      91.3                04/23/2021                PLT                      196                 04/23/2021           )       Anesthesia Quick Evaluation

## 2021-04-23 NOTE — Plan of Care (Signed)
Problem: Education: °Goal: Knowledge of General Education information will improve °Description: Including pain rating scale, medication(s)/side effects and non-pharmacologic comfort measures °Outcome: Completed/Met °  °

## 2021-04-24 ENCOUNTER — Encounter (HOSPITAL_COMMUNITY): Payer: Self-pay | Admitting: Obstetrics and Gynecology

## 2021-04-24 LAB — CBC
HCT: 30.5 % — ABNORMAL LOW (ref 36.0–46.0)
Hemoglobin: 10.2 g/dL — ABNORMAL LOW (ref 12.0–15.0)
MCH: 30.5 pg (ref 26.0–34.0)
MCHC: 33.4 g/dL (ref 30.0–36.0)
MCV: 91.3 fL (ref 80.0–100.0)
Platelets: 167 10*3/uL (ref 150–400)
RBC: 3.34 MIL/uL — ABNORMAL LOW (ref 3.87–5.11)
RDW: 14.4 % (ref 11.5–15.5)
WBC: 9.8 10*3/uL (ref 4.0–10.5)
nRBC: 0 % (ref 0.0–0.2)

## 2021-04-24 LAB — RPR: RPR Ser Ql: NONREACTIVE

## 2021-04-24 LAB — CULTURE, OB URINE: Culture: 10000 — AB

## 2021-04-24 MED ORDER — SIMETHICONE 80 MG PO CHEW: 80.0000 mg | CHEWABLE_TABLET | ORAL | Status: DC | PRN

## 2021-04-24 MED ORDER — ZOLPIDEM TARTRATE 5 MG PO TABS: 5.0000 mg | ORAL_TABLET | Freq: Every evening | ORAL | Status: DC | PRN

## 2021-04-24 MED ORDER — OXYTOCIN-SODIUM CHLORIDE 30-0.9 UT/500ML-% IV SOLN
1.0000 m[IU]/min | INTRAVENOUS | Status: DC
  Administered 2021-04-24: 2 m[IU]/min via INTRAVENOUS

## 2021-04-24 MED ORDER — COCONUT OIL OIL: 1.0000 "application " | TOPICAL_OIL | Status: DC | PRN

## 2021-04-24 MED ORDER — IBUPROFEN 600 MG PO TABS
600.0000 mg | ORAL_TABLET | Freq: Four times a day (QID) | ORAL | Status: DC
  Administered 2021-04-24 – 2021-04-26 (×7): 600 mg via ORAL
  Filled 2021-04-24 (×8): qty 1

## 2021-04-24 MED ORDER — ONDANSETRON HCL 4 MG/2ML IJ SOLN: 4.0000 mg | INTRAMUSCULAR | Status: DC | PRN

## 2021-04-24 MED ORDER — WITCH HAZEL-GLYCERIN EX PADS: 1.0000 "application " | MEDICATED_PAD | CUTANEOUS | Status: DC | PRN

## 2021-04-24 MED ORDER — DIBUCAINE (PERIANAL) 1 % EX OINT: 1.0000 "application " | TOPICAL_OINTMENT | CUTANEOUS | Status: DC | PRN

## 2021-04-24 MED ORDER — ONDANSETRON HCL 4 MG PO TABS: 4.0000 mg | ORAL_TABLET | ORAL | Status: DC | PRN

## 2021-04-24 MED ORDER — TETANUS-DIPHTH-ACELL PERTUSSIS 5-2.5-18.5 LF-MCG/0.5 IM SUSY: 0.5000 mL | PREFILLED_SYRINGE | Freq: Once | INTRAMUSCULAR | Status: DC

## 2021-04-24 MED ORDER — TERBUTALINE SULFATE 1 MG/ML IJ SOLN: 0.2500 mg | Freq: Once | INTRAMUSCULAR | Status: DC | PRN

## 2021-04-24 MED ORDER — ACETAMINOPHEN 325 MG PO TABS
650.0000 mg | ORAL_TABLET | ORAL | Status: DC | PRN
  Administered 2021-04-25: 650 mg via ORAL
  Filled 2021-04-24: qty 2

## 2021-04-24 MED ORDER — SENNOSIDES-DOCUSATE SODIUM 8.6-50 MG PO TABS
2.0000 | ORAL_TABLET | Freq: Every day | ORAL | Status: DC
  Administered 2021-04-25 – 2021-04-26 (×2): 2 via ORAL
  Filled 2021-04-24 (×2): qty 2

## 2021-04-24 MED ORDER — BENZOCAINE-MENTHOL 20-0.5 % EX AERO
1.0000 "application " | INHALATION_SPRAY | CUTANEOUS | Status: DC | PRN
  Administered 2021-04-25: 1 via TOPICAL
  Filled 2021-04-24: qty 56

## 2021-04-24 MED ORDER — DIPHENHYDRAMINE HCL 25 MG PO CAPS: 25.0000 mg | ORAL_CAPSULE | Freq: Four times a day (QID) | ORAL | Status: DC | PRN

## 2021-04-24 MED ORDER — PRENATAL MULTIVITAMIN CH
1.0000 | ORAL_TABLET | Freq: Every day | ORAL | Status: DC
  Administered 2021-04-25 – 2021-04-26 (×2): 1 via ORAL
  Filled 2021-04-24 (×2): qty 1

## 2021-04-24 NOTE — Progress Notes (Signed)
S: Comfortable with epidural. Discussed R/B/A of AROM for labor augmentation and patient consents to procedure. Partner, Lytle Michaels, present and supportive.   O: Vitals:   04/23/21 2331 04/23/21 2336 04/23/21 2341 04/23/21 2346  BP: (!) 96/56     Pulse: (!) 101     Resp: 18     Temp:      TempSrc:      SpO2: 96% 96% 96% 96%  Weight:      Height:       FHT:  FHR: 130 bpm, variability: moderate,  accelerations:  Present,  decelerations:  Absent UC:   regular, every 2-3 minutes SVE:   Dilation: 3 Effacement (%): 50 Station: -3 Exam by:: Gavin Potters, CNM  AROM of a large amount of clear fluid at 0014.   A / P: Spontaneous labor, AROM for augmentation  Fetal Wellbeing:  Category I Pain Control:  Epidural Anticipated MOD:  NSVD  Will recheck SVE in 4 hours. Will consider Pitocin augmentation if no change in SVE.  Dr. Murrell Redden updated on patient status and plan of care.  Suzan Nailer, CNM, MSN 04/24/2021, 12:20 AM

## 2021-04-24 NOTE — Progress Notes (Addendum)
S: Comfortable with epidural.   O: Vitals:   04/24/21 1130 04/24/21 1135 04/24/21 1143 04/24/21 1200  BP: (!) 83/45 (!) 85/47 (!) 88/48 (!) 93/49  Pulse: 74 75 80 75  Resp: 16 16 16    Temp:  97.7 F (36.5 C)    TempSrc:  Axillary    SpO2:      Weight:      Height:       FHT:  FHR: 130 bpm, variability: moderate,  accelerations:  Present,  decelerations:  Present early UC:   regular, every 2-4 minutes, MVUs 230 SVE:   Dilation: 4 Effacement (%): 90 Station: -1 Exam by: Arlis Porta CNM  A / P: Spontaneous labor, s/p AROM, history of LEEP, minimal cervical change, Pitocin infusing at 25mu, MVUs 230 since 1000   Fetal Wellbeing:  Category I Pain Control:  Epidural I/D: GBS unknown, adequately treated with Ampicillin Anticipated MOD:  NSVD  Patients reports neck pain and flushing. Tylenol was given at 1000 for pain. Afebrile. Using PCA button frequently. BPs soft. Adequately hydrated and has had phenylephrine and ephedrine as needed. Discussed with Dr. Murrell Redden. Will repeat CBC now for trend, WBC 14 hours ago 12.8. Will continue to monitor for signs/symptoms of infection. Plan to consult hospitalist post delivery if symptoms persist.   Suzan Nailer, CNM, MSN 04/24/2021, 12:30 PM  Spoke with pt just after CNM Jones exam Neck pain resolved, feeling fine, comfortable; wondering what can be done to make things go faster; no c/o     Spontaneous labor - stalled and now pitocin for augmentation, recently adequate and anticipate cx change, plan svd; cnm managing labor per pt/cnm request, I am following closely Fever - afebrile nearly 24 hrs, wbc wnl on admit, then increased slightly to 12.8 12 hr ago (cbc prior to epidural - increase could have been d/t labor) - pt denies chills/sweats, has some neck pain but she felt that was m/s and feeling better now; thi smay be resolving but will follow closely; fht now with baseline 120s, may also be reflection of improving status (no tachy since fever,  then was 150s and has slowly come to 120s today); will repeat cbc now; currently on abx for gbs prophylaxis; blood cultures x2 and urine culture pending Fetal status reassuring  H/o LEEP  Ama - nml nipt Low normal/hypotension - follow closely, pulse wnl; watch I/o

## 2021-04-24 NOTE — L&D Delivery Note (Signed)
Delivery Note:   [id number] at [redacted]w[redacted]d  Admitting diagnosis: Fever of unknown origin [R50.9] Risks: Fever on admission, prolonged labor Onset of labor: 04/23/2021 at 2125 IOL/Augmentation: AROM and Pitocin ROM: 04/24/21 at 0014  Complete dilation at 04/24/2021  1900 Onset of pushing at 1900 FHR second stage Cat I  Fever on admission, afebrile during labor but warm to touch and vaginal temperature warm. CBC stable. Tylenol given for neck pain and Ampicillin for GBS unknown.   Analgesia Lorriane Shire intrapartum:Epidural  Pushing in lithotomy position with CNM and L&D staff support. Husband, Lytle Michaels, and sister, Eliezer Lofts, present for birth and supportive.  Delivery of a Live born female  Birth Weight:  pending APGAR: 8, 9  Newborn Delivery   Birth date/time: 04/24/2021 19:05:00 Delivery type: Vaginal, Spontaneous     in cephalic presentation, position OA to LOA.  APGAR:1 min-8 , 5 min-9   Nuchal Cord: No  Cord double clamped after cessation of pulsation, cut by Lytle Michaels.  Collection of cord blood for typing completed. Cord blood donation-None  Arterial cord blood sample-No    Placenta delivered-Spontaneous;Pathology  with 3 vessels . Uterotonics: Pitocin Placenta to pathology for fever of unknown origin  Uterine tone firm  Bleeding small  2nd degree  laceration identified.  Episiotomy:None  Local analgesia: N/A  Repair: 2-0 in the usual fashion with excellent hemostasis Est. Blood Loss (ML):465.03   Complications: None  Mom to postpartum.  Baby Jasper to Couplet care / Skin to Skin.  Delivery Report:  Review the Delivery Report for details.    Suzan Nailer, CNM, MSN 04/24/2021, 8:07 PM

## 2021-04-24 NOTE — Progress Notes (Signed)
S: Comfortable. Feeling rectal pressure.   O: Vitals:   04/24/21 1401 04/24/21 1432 04/24/21 1500 04/24/21 1530  BP:  (!) 130/99 100/64 99/63  Pulse:  87 74 77  Resp: 16 18 18 16   Temp:  97.7 F (36.5 C)    TempSrc:  Oral    SpO2:      Weight:      Height:       FHT:  FHR: 135 bpm, variability: moderate,  accelerations:  Present,  decelerations:  Absent UC:   regular, every 2-3 minutes, MVUs 200-230 SVE:   Dilation: 6.5 Effacement (%): 100 Station: 0 Exam by:: Gavin Potters, CNM  Results for orders placed or performed during the hospital encounter of 04/23/21 (from the past 24 hour(s))  CBC     Status: Abnormal   Collection Time: 04/23/21 10:36 PM  Result Value Ref Range   WBC 12.8 (H) 4.0 - 10.5 K/uL   RBC 3.52 (L) 3.87 - 5.11 MIL/uL   Hemoglobin 10.7 (L) 12.0 - 15.0 g/dL   HCT 32.4 (L) 36.0 - 46.0 %   MCV 92.0 80.0 - 100.0 fL   MCH 30.4 26.0 - 34.0 pg   MCHC 33.0 30.0 - 36.0 g/dL   RDW 14.4 11.5 - 15.5 %   Platelets 199 150 - 400 K/uL   nRBC 0.0 0.0 - 0.2 %  RPR     Status: None   Collection Time: 04/23/21 10:36 PM  Result Value Ref Range   RPR Ser Ql NON REACTIVE NON REACTIVE  CBC     Status: Abnormal   Collection Time: 04/24/21 12:29 PM  Result Value Ref Range   WBC 9.8 4.0 - 10.5 K/uL   RBC 3.34 (L) 3.87 - 5.11 MIL/uL   Hemoglobin 10.2 (L) 12.0 - 15.0 g/dL   HCT 30.5 (L) 36.0 - 46.0 %   MCV 91.3 80.0 - 100.0 fL   MCH 30.5 26.0 - 34.0 pg   MCHC 33.4 30.0 - 36.0 g/dL   RDW 14.4 11.5 - 15.5 %   Platelets 167 150 - 400 K/uL   nRBC 0.0 0.0 - 0.2 %     A / P: Spontaneous labor, s/p AROM, history of LEEP, minimal cervical change, Pitocin infusing at 87mu, MVUs adequate, now with cervical change   Fetal Wellbeing:  Category I Pain Control:  Epidural I/D: GBS unknown, adequately treated with Ampicillin, CBC reassuring, afebrile, pain well controlled Anticipated MOD:  NSVD  Dr. Murrell Redden updated on patient status and plan of care.  Suzan Nailer, CNM,  MSN 04/24/2021, 4:03 PM

## 2021-04-24 NOTE — Lactation Note (Signed)
This note was copied from a baby's chart. Lactation Consultation Note  Patient Name: Wendy Clements Date: 04/24/2021   Age:37 hours Per RN Alver Fisher) in L&D mom declined [facility] services in L&D she is experienced in breastfeeding and will let [facility] know if she desires [facility] services on [facility] floor. Maternal Data    Feeding    LATCH Score                    Lactation Tools Discussed/Used    Interventions    Discharge    Consult Status      [name] 04/24/2021, 7:49 PM

## 2021-04-24 NOTE — Progress Notes (Signed)
S: Comfortable with epidural. Partner, Lytle Michaels, and sister, Eliezer Lofts, present and supportive.   O: Vitals:   04/24/21 0730 04/24/21 0736 04/24/21 0741 04/24/21 0800  BP: (!) 88/48  (!) 92/50 (!) 93/49  Pulse: 97  88 94  Resp: 16  16 16   Temp:      TempSrc:      SpO2: 97% 100% 100% 99%  Weight:      Height:       FHT:  FHR: 145 bpm, variability: moderate,  accelerations:  Present,  decelerations:  Absent UC:   irregular, every 1.5-4 minutes SVE:   Dilation: 4 Effacement (%): 80 Station: -3 Exam by:: Gavin Potters, CNM  IUPC placed without difficulty  A / P: Spontaneous labor, s/p AROM, history of LEEP, tight cervical band palpated, Pitocin infusing at 43mu, IUPC placed  Fetal Wellbeing:  Category I Pain Control:  Epidural I/D: GBS unknown, adequately treated with Ampicillin Anticipated MOD:  NSVD  Will continue to titrate Pitocin until adequate MVUs.   Dr. Murrell Redden updated on patient status and plan of care.   Suzan Nailer, CNM, MSN 04/24/2021, 8:23 AM

## 2021-04-25 ENCOUNTER — Inpatient Hospital Stay (HOSPITAL_COMMUNITY): Payer: 59

## 2021-04-25 DIAGNOSIS — R1032 Left lower quadrant pain: Secondary | ICD-10-CM | POA: Diagnosis present

## 2021-04-25 LAB — CBC
HCT: 30.2 % — ABNORMAL LOW (ref 36.0–46.0)
Hemoglobin: 10.4 g/dL — ABNORMAL LOW (ref 12.0–15.0)
MCH: 31 pg (ref 26.0–34.0)
MCHC: 34.4 g/dL (ref 30.0–36.0)
MCV: 89.9 fL (ref 80.0–100.0)
Platelets: 176 10*3/uL (ref 150–400)
RBC: 3.36 MIL/uL — ABNORMAL LOW (ref 3.87–5.11)
RDW: 14.2 % (ref 11.5–15.5)
WBC: 10.7 10*3/uL — ABNORMAL HIGH (ref 4.0–10.5)
nRBC: 0 % (ref 0.0–0.2)

## 2021-04-25 IMAGING — CT CT ABD-PELV W/O CM
2 of 4 series · 16 of 46 positions shown, 18 images · non-contrast
Comparison: None.

CLINICAL DATA: Abdominal pain, acute, nonlocalized.  Postpartum.

EXAM:
CT ABDOMEN AND PELVIS WITHOUT CONTRAST
TECHNIQUE: Multidetector CT imaging of the abdomen and pelvis was performed
following the standard protocol without IV contrast.

[Series 3: abd/ pelvis 5.0 i30f 2 · axial · 0.83mm/px · z∈[+833,+1248]mm · 13 of 91 slices shown, 15 images]
[im 4/91  soft-tissue]
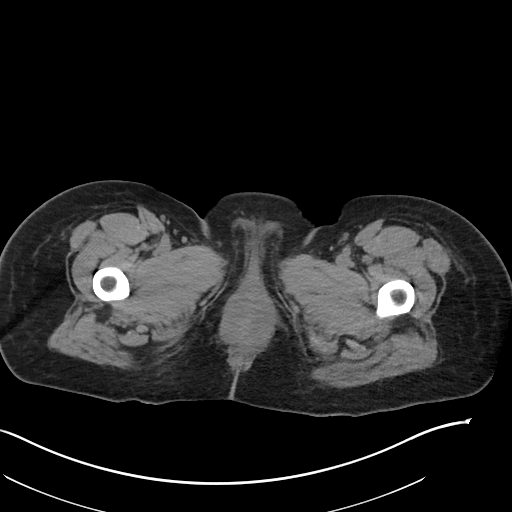
[im 4/91  bone]
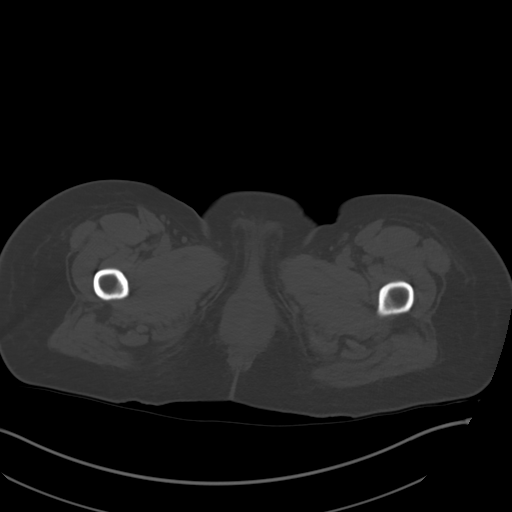
[im 12/91  soft-tissue]
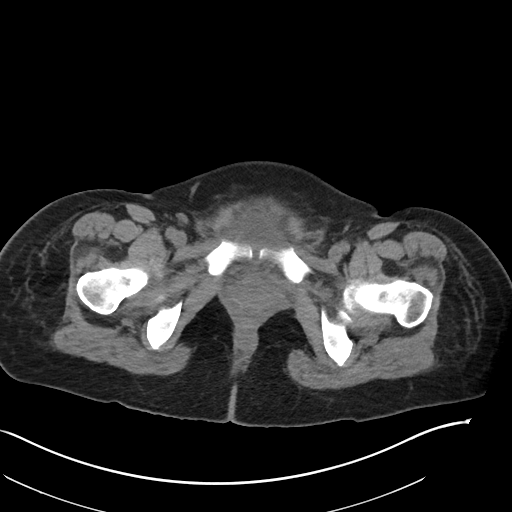
[im 19/91  soft-tissue]
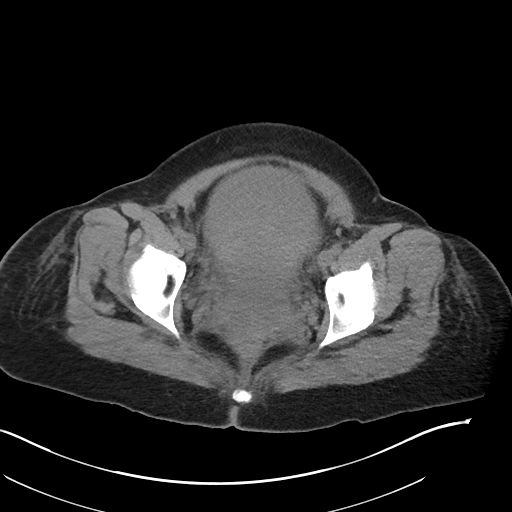
[im 27/91  soft-tissue]
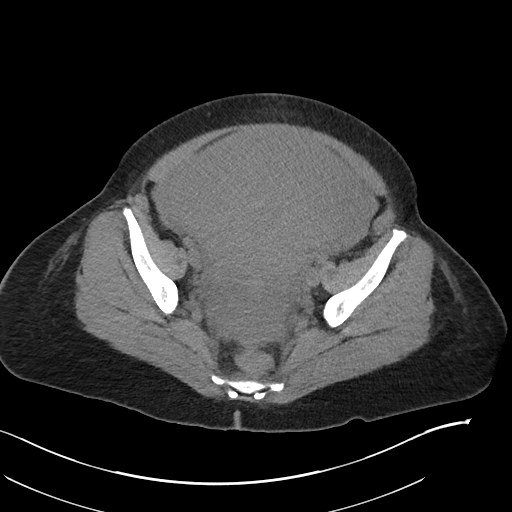
[im 31/91  soft-tissue]
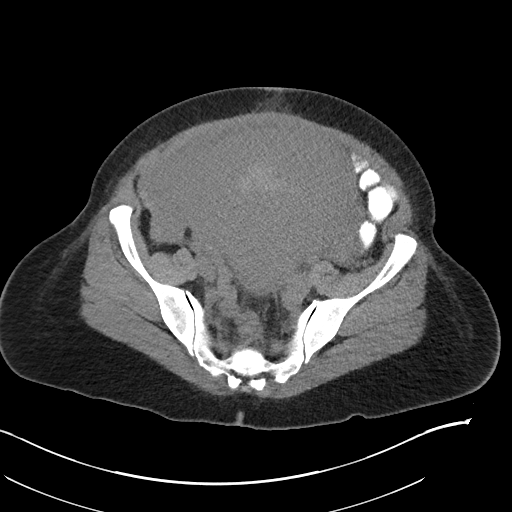
[im 38/91  soft-tissue]
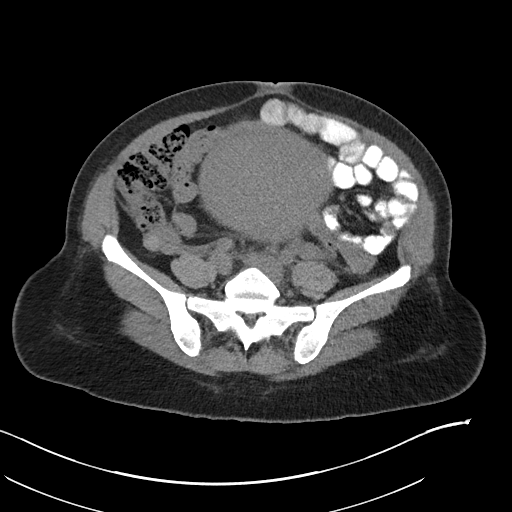
[im 46/91  soft-tissue]
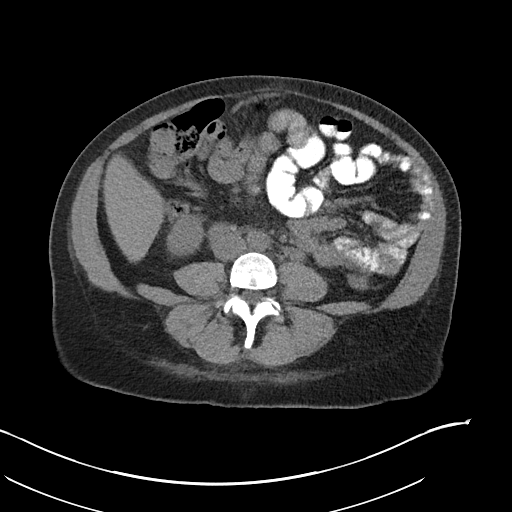
[im 53/91  soft-tissue]
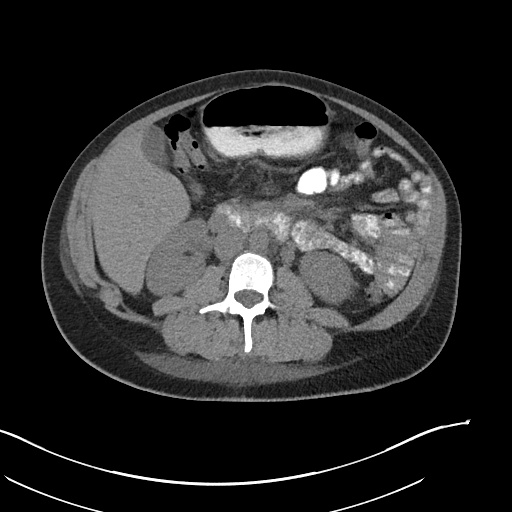
[im 61/91  soft-tissue]
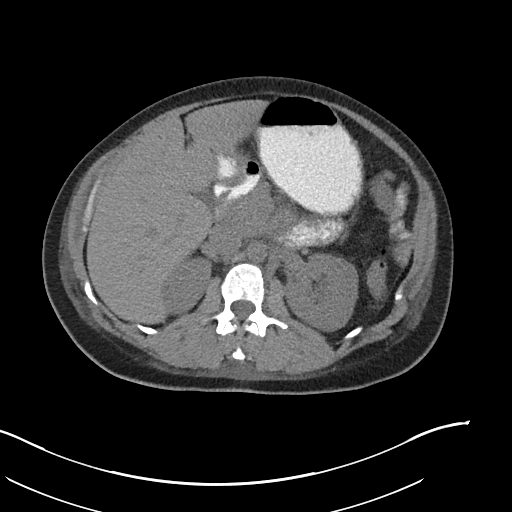
[im 61/91  bone]
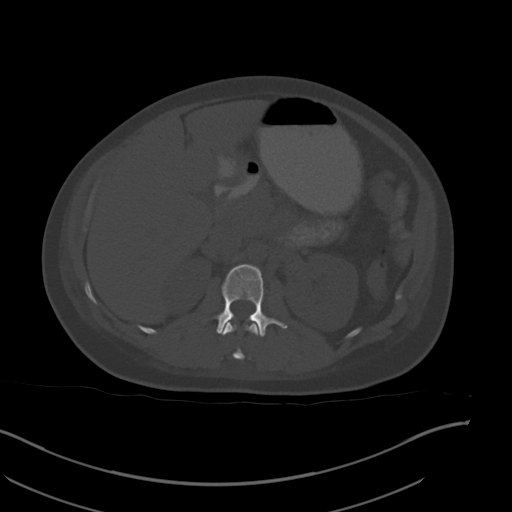
[im 64/91  soft-tissue]
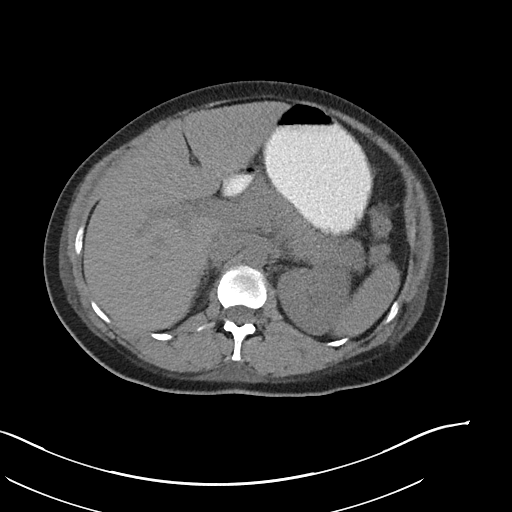
[im 72/91  soft-tissue]
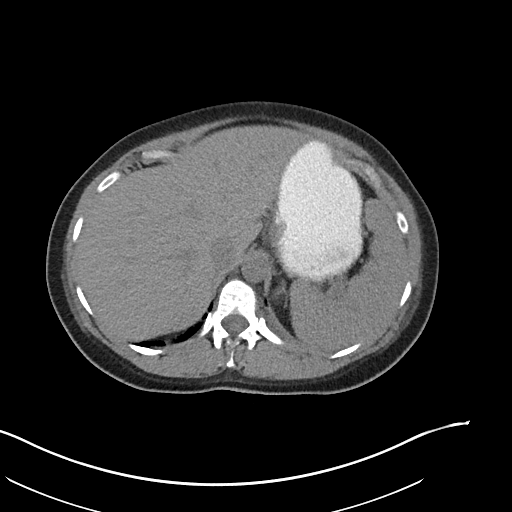
[im 79/91  soft-tissue]
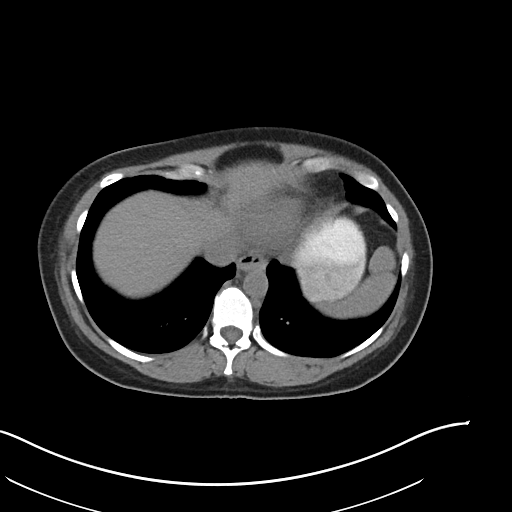
[im 87/91  soft-tissue]
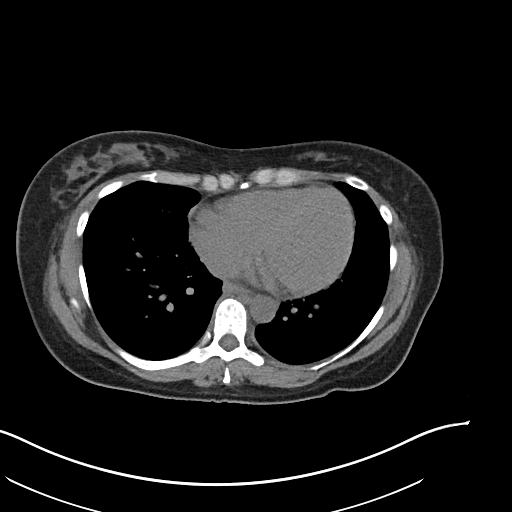

[Series 6: cor st · coronal · 0.82mm/px · 3 of 98 slices shown]
[im 33/98  soft-tissue]
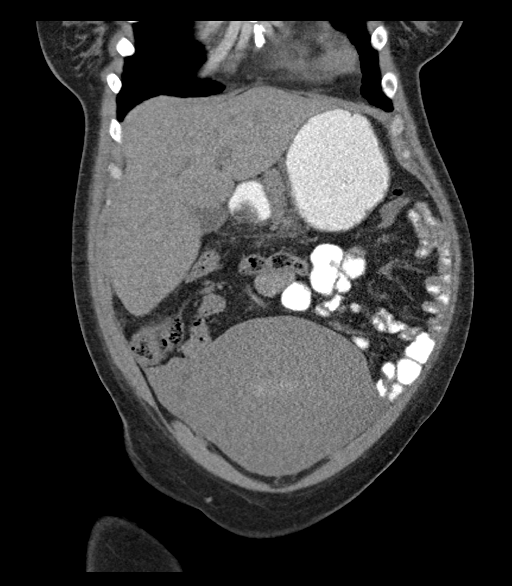
[im 44/98  soft-tissue]
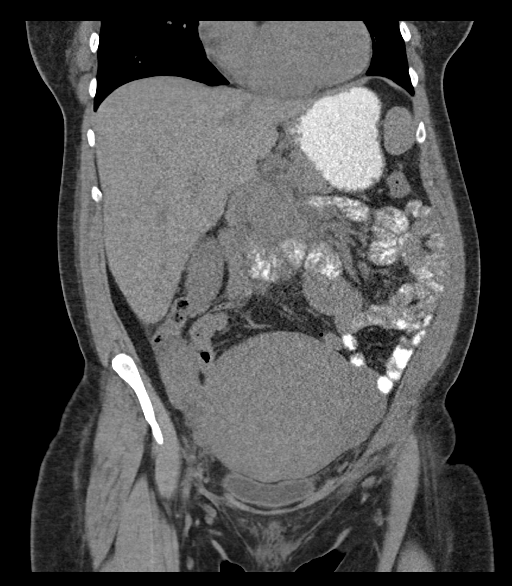
[im 54/98  soft-tissue]
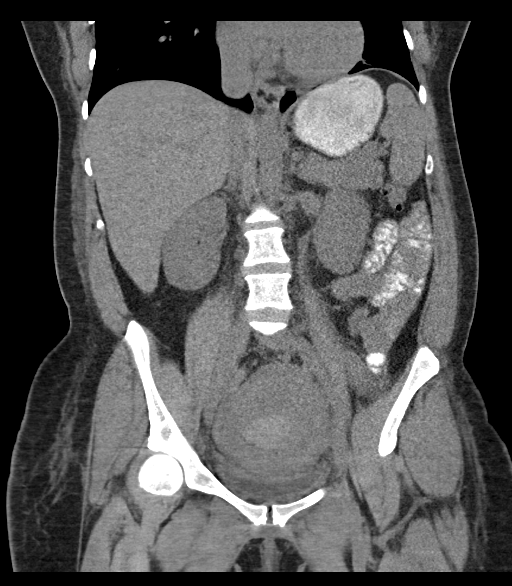

[16 of 46 positions shown; findings below may reference images not displayed]

FINDINGS: Lower chest: No acute abnormality.

Hepatobiliary: No focal liver abnormality is seen. No gallstones,
gallbladder wall thickening, or biliary dilatation.

Pancreas: Unremarkable

Spleen: Unremarkable

Adrenals/Urinary Tract: Adrenal glands are unremarkable. Kidneys are
normal, without renal calculi, focal lesion, or hydronephrosis.
Bladder is unremarkable.

Stomach/Bowel: Stomach is within normal limits. Appendix appears
normal. No evidence of bowel wall thickening, distention, or
inflammatory changes.

Vascular/Lymphatic: The ovarian veins are dilated in keeping with
history of recent gestation. The abdominal vasculature is otherwise
unremarkable on this noncontrast examination. No pathologic
adenopathy identified.

Reproductive: The uterus is enlarged in keeping with history of
recent gestation. There is intracavitary hyperdense material noted
within the uterine fundus and within the lower uterine segment
expanding the endocervical canal, best seen on axial image # 70/3
and sagittal image # 74/7 most in keeping with residual hemorrhagic
debris or possibly retained products of conception. No adnexal mass.

Other: No abdominal wall hernia.

Musculoskeletal: No acute or significant osseous findings.
IMPRESSION: Uterine enlargement in keeping with history of recent gestation.
Hyperdense material within the uterine cavity and within the lower
uterine segment expanding the endocervical canal and extending into
the vaginal canal in keeping with residual hemorrhagic debris or
potentially retained products of conception.

## 2021-04-25 MED ORDER — IOHEXOL 9 MG/ML PO SOLN
ORAL | Status: AC
  Filled 2021-04-25: qty 1000

## 2021-04-25 NOTE — Progress Notes (Signed)
Pt sitting comfortably in bed States llq pain for couple weeks - points to fundus of uterus, and slightly left of midline; says sometimes left side seems more swollen than the other No severe pain  Patient Vitals for the past 24 hrs:  BP Temp Temp src Pulse Resp SpO2  04/25/21 1526 99/67 97.8 F (36.6 C) Oral 79 16 100 %  04/25/21 0832 94/63 97.6 F (36.4 C) Oral 70 16 --  04/25/21 0632 98/78 (!) 97.5 F (36.4 C) Oral 68 16 --  04/25/21 0222 102/71 97.6 F (36.4 C) Oral 71 16 --   A&ox3 Nml respirations Abd: soft, nt,nd; fundus firm and below umb; palpated right where pt c/o tenderness - at fundus    FINDINGS: Lower chest: No acute abnormality.   Hepatobiliary: No focal liver abnormality is seen. No gallstones, gallbladder wall thickening, or biliary dilatation.   Pancreas: Unremarkable   Spleen: Unremarkable   Adrenals/Urinary Tract: Adrenal glands are unremarkable. Kidneys are normal, without renal calculi, focal lesion, or hydronephrosis. Bladder is unremarkable.   Stomach/Bowel: Stomach is within normal limits. Appendix appears normal. No evidence of bowel wall thickening, distention, or inflammatory changes.   Vascular/Lymphatic: The ovarian veins are dilated in keeping with history of recent gestation. The abdominal vasculature is otherwise unremarkable on this noncontrast examination. No pathologic adenopathy identified.   Reproductive: The uterus is enlarged in keeping with history of recent gestation. There is intracavitary hyperdense material noted within the uterine fundus and within the lower uterine segment expanding the endocervical canal, best seen on axial image # 70/3 and sagittal image # 74/7 most in keeping with residual hemorrhagic debris or possibly retained products of conception. No adnexal mass.   Other: No abdominal wall hernia.   Musculoskeletal: No acute or significant osseous findings.   IMPRESSION: Uterine enlargement in keeping with  history of recent gestation. Hyperdense material within the uterine cavity and within the lower uterine segment expanding the endocervical canal and extending into the vaginal canal in keeping with residual hemorrhagic debris or potentially retained products of conception.   A/P ppd 1 s/p svd Llq pain - s/p ct and reviewed essentially normal findings; based on exam and where she describes her pain, this appears to be uterine and normal on my exam; discussed material noted in uterus but this could be bleeding debris from recent delivery vs retained poc - pt with nml lochia, I would contin to monitor bleeding and encourage ibuprofen; consider u/s as outpatient to follow and confirm wnl endometrium; pt and husband seemed reassured; will contin to follow closely

## 2021-04-25 NOTE — Social Work (Signed)
CSW received consult for hx of Anxiety and Edinburgh of 12.  CSW met with MOB to offer support and complete assessment.     CSW introduced self and role. CSW observed infant 'Jasper sleeping in bassinet. MOB was pleasant and engaged. CSW informed MOB of reason for consult and assessed current mood. MOB shared she is currently doing "wonderful." MOB disclosed she was worried she has pregnancy depression, however she is doing well now that infant is here. 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 MOB reported she was diagnosed with anxiety around 2010 and was on Xanax PRN at one time, but did not like the side effects. MOB stated working out has helped cope with symptoms. MOB reported a strong support system consisting of FOB and immediate family. MOB denies any current SI, HI or being involved in DV.  CSW provided education regarding the baby blues period versus PPD and provided resources. CSW provided the New Mom Checklist and encouraged MOB to self evaluate and contact a medical professional if symptoms are noted at any time.   CSW provided review of Sudden Infant Death Syndrome (SIDS) precautions. MOB has all infant needs, including a bassinet and car seat. MOB denies any barriers to infant follow-up care. MOB denies having any additional needs at this time.  CSW identifies no further need for intervention and no barriers to discharge at this time.  Darra Lis, [employer] [phone]

## 2021-04-25 NOTE — Progress Notes (Signed)
PPD # 1 S/P NSVD  Live born female  Birth Weight: 7 lb 3.7 oz (3280 g) APGAR: 8, 9  Newborn Delivery   Birth date/time: 04/24/2021 19:05:00 Delivery type: Vaginal, Spontaneous     Baby name: Wendy Clements Delivering provider: Gavin Potters K  Episiotomy:None   Lacerations:2nd degree   Circumcision Yes, planning  Feeding: breast and bottle  Pain control at delivery: Epidural   S:  Reports consistent left lower abdominal pain. Present during pregnancy, worse on admission with fever, and persisting now postpartum. Rates pain 6/10 on pain scale. Tender to touch. Reports vaginal bleeding as moderate and mild perineal pain. Breast and bottle feeding. Husband, Wendy Clements, present and supportive.              Tolerating PO/No nausea or vomiting             Bleeding is moderate             Pain controlled with acetaminophen and ibuprofen (OTC)             Up ad lib/ambulatory/voiding without difficulties   O:  A & O x 3, in no apparent distress  Vitals:   04/24/21 2200 04/25/21 0222 04/25/21 0632 04/25/21 0832  BP: 111/69 102/71 98/78 94/63   Pulse: 85 71 68 70  Resp:  16 16 16   Temp: 98.7 F (37.1 C) 97.6 F (36.4 C) (!) 97.5 F (36.4 C) 97.6 F (36.4 C)  TempSrc: Oral Oral Oral Oral  SpO2:      Weight:      Height:       Recent Labs    04/24/21 1229 04/25/21 0453  WBC 9.8 10.7*  HGB 10.2* 10.4*  HCT 30.5* 30.2*  PLT 167 176    Blood type: --/--/O POS (12/31 1505)  Rubella: Immune (06/29 0000)   I&O: I/O last 3 completed shifts: In: 2132.6 [P.O.:100; I.V.:2032.6] Out: 7510 [Urine:3275; Blood:200]          No intake/output data recorded.   Gen: AAO x 3, NAD  Abdomen: soft, left sided tenderness, no guarding            Fundus: firm, non-tender, U-1  Perineum: repair intact  Lochia: moderate  Extremities: 1+ non-pitting edema, no calf pain or tenderness   A/P:  PPD # 1 36 y.o., [id number]  Principal Problem:   Postpartum care following vaginal delivery 1/1  Doing well - stable  status  Routine post partum orders Active Problems:   Fever of unknown origin  Afebrile for last 24 hours  WBCs stable at 10.7  Denies chills or body aches   OCD (obsessive compulsive disorder)  Stable  No current meds  Close PP F/U for mood   SVD 1/1   Second degree perineal laceration  Discussed perineal care and comfort measures.    Abdominal pain, left lower quadrant  Tender and diffuse lower left abdominal pain  Labs stable  Discussed with Dr. Benjie Karvonen who recommends CT of abdomen/pelvis; ordered for today  Continue to monitor  Anticipate discharge tomorrow.   Suzan Nailer, MSN, CNM 04/25/2021, 11:47 AM

## 2021-04-25 NOTE — Anesthesia Postprocedure Evaluation (Signed)
Anesthesia Post Note  Patient: Wendy Clements  Procedure(s) Performed: AN AD HOC LABOR EPIDURAL     Patient location during evaluation: Mother Baby Anesthesia Type: Epidural Level of consciousness: awake and alert and oriented Pain management: satisfactory to patient Vital Signs Assessment: post-procedure vital signs reviewed and stable Respiratory status: respiratory function stable Cardiovascular status: stable Postop Assessment: no headache, no backache, epidural receding, patient able to bend at knees, no signs of nausea or vomiting, adequate PO intake and able to ambulate Anesthetic complications: no   No notable events documented.  Last Vitals:  Vitals:   04/25/21 0632 04/25/21 0832  BP: 98/78 94/63  Pulse: 68 70  Resp: 16 16  Temp: (!) 36.4 C 36.4 C  SpO2:      Last Pain:  Vitals:   04/25/21 0832  TempSrc: Oral  PainSc:    Pain Goal: Patients Stated Pain Goal: 3 (04/23/21 1548)                 Katherina Mires

## 2021-04-26 MED ORDER — ACETAMINOPHEN 325 MG PO TABS: 650.0000 mg | ORAL_TABLET | ORAL | 1 refills | Status: AC | PRN

## 2021-04-26 MED ORDER — IBUPROFEN 600 MG PO TABS: 600.0000 mg | ORAL_TABLET | Freq: Four times a day (QID) | ORAL | 0 refills | Status: AC

## 2021-04-26 NOTE — Discharge Summary (Signed)
OB Discharge Summary  Patient Name: Wendy Clements DOB: 31-Mar-1986 MRN: [id number]  Date of admission: 04/23/2021 Delivering provider: Gavin Potters K   Admitting diagnosis: Fever of unknown origin [R50.9] Fever [R50.9] Intrauterine pregnancy: [redacted]w[redacted]d     Secondary diagnosis: Patient Active Problem List   Diagnosis Date Noted   Abdominal pain, left lower quadrant 04/25/2021   SVD 1/1 04/24/2021   Second degree perineal laceration 04/24/2021   Postpartum care following vaginal delivery 1/1 04/24/2021   Fever of unknown origin 04/23/2021   OCD (obsessive compulsive disorder) 04/23/2021    Date of discharge: 04/26/2021   Discharge diagnosis: Principal Problem:   Postpartum care following vaginal delivery 1/1 Active Problems:   Fever of unknown origin   OCD (obsessive compulsive disorder)   SVD 1/1   Second degree perineal laceration   Abdominal pain, left lower quadrant                                                             Post partum procedures: CT scan  Augmentation: AROM and Pitocin Pain control: Epidural  Laceration:2nd degree  Episiotomy:None  Complications: None  Hospital course:  Onset of Labor With Vaginal Delivery      36 y.o. yo [id number] at [redacted]w[redacted]d was admitted in Latent Labor on 04/23/2021. Patient had an uncomplicated labor course as follows:  Membrane Rupture Time/Date: 12:14 AM ,04/24/2021   Delivery Method:Vaginal, Spontaneous  Episiotomy: None  Lacerations:  2nd degree  Patient had a postpartum course complicated by lower left abdominal pain. Her labs were reassuring. She had a CT scan on PP day 1 that was negative. She is ambulating, tolerating a regular diet, passing flatus, and urinating well. Patient is discharged home in stable condition on 04/26/21.  Newborn Data: Birth date:04/24/2021  Birth time:7:05 PM  Gender:Female  Living status:Living  Apgars:8 ,9  Weight:3280 g   Physical exam  Vitals:   04/25/21 0832 04/25/21 1526 04/26/21 0029 04/26/21  0615  BP: 94/63 99/67 100/66 102/70  Pulse: 70 79 71 70  Resp: 16 16 18 16   Temp: 97.6 F (36.4 C) 97.8 F (36.6 C) 97.7 F (36.5 C) (!) 97.4 F (36.3 C)  TempSrc: Oral Oral Oral Oral  SpO2:  100%    Weight:      Height:       General: alert, cooperative, and no distress Lochia: appropriate Uterine Fundus: firm Incision: N/A Perineum: repair intact, no edema DVT Evaluation: No evidence of DVT seen on physical exam.  Labs: Lab Results  Component Value Date   WBC 10.7 (H) 04/25/2021   HGB 10.4 (L) 04/25/2021   HCT 30.2 (L) 04/25/2021   MCV 89.9 04/25/2021   PLT 176 04/25/2021   CMP Latest Ref Rng & Units 04/23/2021  Glucose 70 - 99 mg/dL 94  BUN 6 - 20 mg/dL <5(L)  Creatinine 0.44 - 1.00 mg/dL 0.65  Sodium 135 - 145 mmol/L 132(L)  Potassium 3.5 - 5.1 mmol/L 3.7  Chloride 98 - 111 mmol/L 102  CO2 22 - 32 mmol/L 20(L)  Calcium 8.9 - 10.3 mg/dL 8.3(L)  Total Protein 6.5 - 8.1 g/dL 6.2(L)  Total Bilirubin 0.3 - 1.2 mg/dL 0.6  Alkaline Phos 38 - 126 U/L 151(H)  AST 15 - 41 U/L 25  ALT 0 - 44 U/L 17  Edinburgh Postnatal Depression Scale Screening Tool 04/24/2021  I have been able to laugh and see the funny side of things. 0  I have looked forward with enjoyment to things. 1  I have blamed myself unnecessarily when things went wrong. 0  I have been anxious or worried for no good reason. 2  I have felt scared or panicky for no good reason. 2  Things have been getting on top of me. 1  I have been so unhappy that I have had difficulty sleeping. 2  I have felt sad or miserable. 2  I have been so unhappy that I have been crying. 2  The thought of harming myself has occurred to me. 0  Edinburgh Postnatal Depression Scale Total 12   Vaccines: TDaP UTD         Flu    Declined         COVID-19   Declined  Discharge instructions:  per After Visit Summary  After Visit Meds:  Allergies as of 04/26/2021   No Known Allergies      Medication List     STOP taking these  medications    benzonatate 100 MG capsule Commonly known as: TESSALON       TAKE these medications    acetaminophen 325 MG tablet Commonly known as: Tylenol Take 2 tablets (650 mg total) by mouth every 4 (four) hours as needed (for pain scale < 4).   albuterol 108 (90 Base) MCG/ACT inhaler Commonly known as: VENTOLIN HFA Inhale 1-2 puffs into the lungs every 6 (six) hours as needed for wheezing or shortness of breath.   cyclobenzaprine 10 MG tablet Commonly known as: FLEXERIL Take 1 tablet (10 mg total) by mouth 2 (two) times daily as needed for muscle spasms.   fluticasone 50 MCG/ACT nasal spray Commonly known as: FLONASE Place 1 spray into both nostrils daily.   ibuprofen 600 MG tablet Commonly known as: ADVIL Take 1 tablet (600 mg total) by mouth every 6 (six) hours.   montelukast 10 MG tablet Commonly known as: SINGULAIR Take 10 mg by mouth at bedtime.   prenatal multivitamin Tabs tablet Take 1 tablet by mouth daily at 12 noon.       Diet: routine diet  Activity: Advance as tolerated. Pelvic rest for 6 weeks.   Newborn Data: Live born female  Birth Weight: 7 lb 3.7 oz (3280 g) APGAR: 8, 9  Newborn Delivery   Birth date/time: 04/24/2021 19:05:00 Delivery type: Vaginal, Spontaneous     Named Jasper Baby Feeding: Bottle and Breast Disposition:home with mother  Delivery Report:  Review the Delivery Report for details.    Follow up:  Follow-up Information     Suzan Nailer, CNM Follow up on 05/05/2021.   Specialty: Certified Nurse Midwife Contact information: [place] [address] [place], CNM, MSN 04/26/2021, 9:56 AM

## 2021-04-27 LAB — SURGICAL PATHOLOGY

## 2021-04-28 LAB — CULTURE, BLOOD (ROUTINE X 2): Culture: NO GROWTH

## 2021-05-07 ENCOUNTER — Telehealth (HOSPITAL_COMMUNITY): Payer: Self-pay

## 2021-05-07 NOTE — Telephone Encounter (Signed)
°  No answer. Left message to return nurse call.  Sharyn Lull [facility] 05/07/2021,1505

## 2021-05-07 NOTE — Telephone Encounter (Deleted)
No answer. Left message to return nurse call.  Sharyn Lull [facility] 05/07/2021,1503

## 2021-05-08 ENCOUNTER — Inpatient Hospital Stay (HOSPITAL_COMMUNITY): Payer: Self-pay

## 2021-05-08 ENCOUNTER — Inpatient Hospital Stay (HOSPITAL_COMMUNITY): Admission: AD | Admit: 2021-05-08 | Payer: Self-pay | Source: Home / Self Care | Admitting: Obstetrics and Gynecology
